# Patient Record
Sex: Female | Born: 1955 | Race: White | Hispanic: No | Marital: Single
Health system: Southern US, Community
[De-identification: ages and names within clinical notes are randomized; demographics above are authoritative.]

## PROBLEM LIST (undated history)

## (undated) DIAGNOSIS — I639 Cerebral infarction, unspecified: Secondary | ICD-10-CM

## (undated) DIAGNOSIS — I4891 Unspecified atrial fibrillation: Secondary | ICD-10-CM

## (undated) DIAGNOSIS — I10 Essential (primary) hypertension: Secondary | ICD-10-CM

## (undated) DIAGNOSIS — E119 Type 2 diabetes mellitus without complications: Secondary | ICD-10-CM

## (undated) HISTORY — PX: EYE SURGERY: SHX253

---

## 2001-11-04 ENCOUNTER — Encounter: Admission: RE | Admit: 2001-11-04 | Discharge: 2002-02-02 | Payer: Self-pay | Admitting: Internal Medicine

## 2009-09-30 DIAGNOSIS — I4891 Unspecified atrial fibrillation: Secondary | ICD-10-CM

## 2009-09-30 HISTORY — DX: Unspecified atrial fibrillation: I48.91

## 2009-10-04 ENCOUNTER — Inpatient Hospital Stay (HOSPITAL_COMMUNITY): Admission: EM | Admit: 2009-10-04 | Discharge: 2009-10-06 | Payer: Self-pay | Admitting: Emergency Medicine

## 2009-10-05 ENCOUNTER — Encounter (INDEPENDENT_AMBULATORY_CARE_PROVIDER_SITE_OTHER): Payer: Self-pay | Admitting: Cardiovascular Disease

## 2009-10-05 ENCOUNTER — Other Ambulatory Visit: Payer: Self-pay | Admitting: Cardiovascular Disease

## 2009-10-06 ENCOUNTER — Encounter (INDEPENDENT_AMBULATORY_CARE_PROVIDER_SITE_OTHER): Payer: Self-pay | Admitting: Cardiovascular Disease

## 2010-12-16 LAB — GLUCOSE, CAPILLARY
Glucose-Capillary: 131 mg/dL — ABNORMAL HIGH (ref 70–99)
Glucose-Capillary: 147 mg/dL — ABNORMAL HIGH (ref 70–99)
Glucose-Capillary: 165 mg/dL — ABNORMAL HIGH (ref 70–99)
Glucose-Capillary: 165 mg/dL — ABNORMAL HIGH (ref 70–99)

## 2010-12-16 LAB — CBC
HCT: 38.7 % (ref 36.0–46.0)
HCT: 42 % (ref 36.0–46.0)
Hemoglobin: 12.9 g/dL (ref 12.0–15.0)
Hemoglobin: 13.2 g/dL (ref 12.0–15.0)
Hemoglobin: 14.2 g/dL (ref 12.0–15.0)
MCHC: 33.7 g/dL (ref 30.0–36.0)
MCV: 93.9 fL (ref 78.0–100.0)
RBC: 4.07 MIL/uL (ref 3.87–5.11)
RBC: 4.16 MIL/uL (ref 3.87–5.11)
RDW: 12.3 % (ref 11.5–15.5)
RDW: 12.6 % (ref 11.5–15.5)
WBC: 5.8 10*3/uL (ref 4.0–10.5)

## 2010-12-16 LAB — BASIC METABOLIC PANEL
CO2: 28 mEq/L (ref 19–32)
Calcium: 8.6 mg/dL (ref 8.4–10.5)
Creatinine, Ser: 0.74 mg/dL (ref 0.4–1.2)
GFR calc non Af Amer: >60 mL/min (ref >60)
Glucose, Bld: 195 mg/dL — ABNORMAL HIGH (ref 70–99)
Glucose, Bld: 266 mg/dL — ABNORMAL HIGH (ref 70–99)
Potassium: 2.8 mEq/L — ABNORMAL LOW (ref 3.5–5.1)
Sodium: 132 mEq/L — ABNORMAL LOW (ref 135–145)

## 2010-12-16 LAB — DIFFERENTIAL
Basophils Absolute: 0 10*3/uL (ref 0.0–0.1)
Eosinophils Relative: 0 % (ref 0–5)
Lymphocytes Relative: 10 % — ABNORMAL LOW (ref 12–46)
Lymphs Abs: 1 10*3/uL (ref 0.7–4.0)
Monocytes Absolute: 0.3 10*3/uL (ref 0.1–1.0)

## 2010-12-16 LAB — PROTIME-INR
INR: 0.88 (ref 0.00–1.49)
Prothrombin Time: 11.9 seconds (ref 11.6–15.2)

## 2010-12-16 LAB — CARDIAC PANEL(CRET KIN+CKTOT+MB+TROPI): CK, MB: 4.8 ng/mL — ABNORMAL HIGH (ref 0.3–4.0)

## 2010-12-16 LAB — LIPID PANEL
Cholesterol: 159 mg/dL (ref 0–200)
HDL: 53 mg/dL (ref >39)
LDL Cholesterol: 87 mg/dL (ref 0–99)
Triglycerides: 93 mg/dL (ref <150)

## 2010-12-16 LAB — POCT CARDIAC MARKERS
CKMB, poc: 3.7 ng/mL (ref 1.0–8.0)
Myoglobin, poc: 81.3 ng/mL (ref 12–200)
Troponin i, poc: <0.05 ng/mL (ref 0.00–0.09)
Troponin i, poc: <0.05 ng/mL (ref 0.00–0.09)

## 2010-12-16 LAB — HEPARIN LEVEL (UNFRACTIONATED): Heparin Unfractionated: 0.5 IU/mL (ref 0.30–0.70)

## 2010-12-16 LAB — BRAIN NATRIURETIC PEPTIDE: Pro B Natriuretic peptide (BNP): 228 pg/mL — ABNORMAL HIGH (ref 0.0–100.0)

## 2016-11-29 ENCOUNTER — Encounter (HOSPITAL_COMMUNITY): Payer: Self-pay | Admitting: *Deleted

## 2016-11-29 ENCOUNTER — Emergency Department (HOSPITAL_COMMUNITY): Payer: BLUE CROSS/BLUE SHIELD

## 2016-11-29 ENCOUNTER — Emergency Department (HOSPITAL_BASED_OUTPATIENT_CLINIC_OR_DEPARTMENT_OTHER)
Admit: 2016-11-29 | Discharge: 2016-11-29 | Disposition: A | Discharge status: Home / Self Care | Payer: BLUE CROSS/BLUE SHIELD | Attending: Emergency Medicine | Admitting: Emergency Medicine

## 2016-11-29 ENCOUNTER — Emergency Department (HOSPITAL_COMMUNITY)
Admission: EM | Admit: 2016-11-29 | Discharge: 2016-11-29 | Disposition: A | Discharge status: Home / Self Care | Payer: BLUE CROSS/BLUE SHIELD | Attending: Emergency Medicine | Admitting: Emergency Medicine

## 2016-11-29 DIAGNOSIS — Z794 Long term (current) use of insulin: Secondary | ICD-10-CM | POA: Diagnosis not present

## 2016-11-29 DIAGNOSIS — M7989 Other specified soft tissue disorders: Secondary | ICD-10-CM

## 2016-11-29 DIAGNOSIS — I10 Essential (primary) hypertension: Secondary | ICD-10-CM | POA: Diagnosis not present

## 2016-11-29 DIAGNOSIS — Z7982 Long term (current) use of aspirin: Secondary | ICD-10-CM | POA: Diagnosis not present

## 2016-11-29 DIAGNOSIS — E119 Type 2 diabetes mellitus without complications: Secondary | ICD-10-CM | POA: Insufficient documentation

## 2016-11-29 DIAGNOSIS — M25522 Pain in left elbow: Secondary | ICD-10-CM | POA: Diagnosis not present

## 2016-11-29 HISTORY — DX: Type 2 diabetes mellitus without complications: E11.9

## 2016-11-29 HISTORY — DX: Essential (primary) hypertension: I10

## 2016-11-29 LAB — CBC
HCT: 40.1 % (ref 36.0–46.0)
HEMOGLOBIN: 14.2 g/dL (ref 12.0–15.0)
MCH: 30.7 pg (ref 26.0–34.0)
MCHC: 35.4 g/dL (ref 30.0–36.0)
MCV: 86.8 fL (ref 78.0–100.0)
Platelets: 289 10*3/uL (ref 150–400)
RBC: 4.62 MIL/uL (ref 3.87–5.11)
RDW: 12.9 % (ref 11.5–15.5)
WBC: 12.8 10*3/uL — AB (ref 4.0–10.5)

## 2016-11-29 LAB — URINALYSIS, ROUTINE W REFLEX MICROSCOPIC
Bacteria, UA: NONE SEEN
Bilirubin Urine: NEGATIVE
Glucose, UA: >=500 mg/dL — AB
KETONES UR: NEGATIVE mg/dL
LEUKOCYTES UA: NEGATIVE
NITRITE: NEGATIVE
PH: 8 (ref 5.0–8.0)
Protein, ur: >=300 mg/dL — AB
SPECIFIC GRAVITY, URINE: 1.003 — AB (ref 1.005–1.030)
Squamous Epithelial / LPF: NONE SEEN

## 2016-11-29 LAB — COMPREHENSIVE METABOLIC PANEL
ALBUMIN: 4.2 g/dL (ref 3.5–5.0)
ALK PHOS: 70 U/L (ref 38–126)
ALT: 21 U/L (ref 14–54)
ANION GAP: 11 (ref 5–15)
AST: 35 U/L (ref 15–41)
BILIRUBIN TOTAL: 1.9 mg/dL — AB (ref 0.3–1.2)
BUN: 13 mg/dL (ref 6–20)
CALCIUM: 9 mg/dL (ref 8.9–10.3)
CO2: 26 mmol/L (ref 22–32)
Chloride: 88 mmol/L — ABNORMAL LOW (ref 101–111)
Creatinine, Ser: 0.83 mg/dL (ref 0.44–1.00)
GLUCOSE: 234 mg/dL — AB (ref 65–99)
POTASSIUM: 3.2 mmol/L — AB (ref 3.5–5.1)
Sodium: 125 mmol/L — ABNORMAL LOW (ref 135–145)
TOTAL PROTEIN: 8.3 g/dL — AB (ref 6.5–8.1)

## 2016-11-29 LAB — LIPASE, BLOOD: Lipase: 16 U/L (ref 11–51)

## 2016-11-29 LAB — TROPONIN I: Troponin I: <0.03 ng/mL (ref <0.03)

## 2016-11-29 MED ORDER — SODIUM CHLORIDE 0.9 % IV BOLUS (SEPSIS)
1000.0000 mL | Freq: Once | INTRAVENOUS | Status: AC
  Administered 2016-11-29: 1000 mL via INTRAVENOUS

## 2016-11-29 MED ORDER — ONDANSETRON HCL 4 MG/2ML IJ SOLN: 4.0000 mg | Freq: Once | INTRAMUSCULAR | Status: DC | PRN

## 2016-11-29 MED ORDER — ONDANSETRON HCL 4 MG/2ML IJ SOLN
4.0000 mg | Freq: Once | INTRAMUSCULAR | Status: AC
  Administered 2016-11-29: 4 mg via INTRAVENOUS
  Filled 2016-11-29: qty 2

## 2016-11-29 MED ORDER — MORPHINE SULFATE (PF) 4 MG/ML IV SOLN
6.0000 mg | Freq: Once | INTRAVENOUS | Status: AC
  Administered 2016-11-29: 6 mg via INTRAVENOUS
  Filled 2016-11-29: qty 2

## 2016-11-29 MED ORDER — ONDANSETRON HCL 4 MG PO TABS: 8.0000 mg | ORAL_TABLET | Freq: Three times a day (TID) | ORAL | 0 refills | Status: AC | PRN

## 2016-11-29 MED ORDER — HYDROCODONE-ACETAMINOPHEN 5-325 MG PO TABS: 1.0000 | ORAL_TABLET | ORAL | 0 refills | Status: DC | PRN

## 2016-11-29 NOTE — ED Triage Notes (Addendum)
Pt reports vomiting since yesterday, denies any abd pain or diarrhea at this time.  Pt also reports L hand swelling and pain that radiates to her L elbow.  Swelling noted.  Pt denies any injury.  Pt reports she has not been able to eat or drink much.  Feels slightly dizzy.  Pt is A&Ox 4.  Pt is a poor historian, when asked if she is okay with receiving blood transfusions if she was found to have a low Hgb, pt states "I don't know, I will have to ask my mother."  Pt is also unsure whether she is allergic to penicillin or anything.

## 2016-11-29 NOTE — ED Provider Notes (Signed)
[facility]-EMERGENCY DEPT Provider Note   CSN: [id number] Arrival date & time: 11/29/16  1139     History   Chief Complaint Chief Complaint  Patient presents with  . Vomiting  . Arm Swelling    HPI Debra Sanders is a 61 y.o. female.  HPI Patient reports increasing swelling of her left arm as well as increasing pain in her left elbow over the past several days.  She also reports nausea and vomiting today with decreased oral intake and mild lightheadedness.  No syncope.  No chest pain or palpitations.  Pain in her left elbow is moderate in severity.  No history DVT.  No weakness of her left arm.  No known trauma to her left arm.  Denies warmth or redness to her left upper extremity.  Symptoms are moderate in severity.  No abdominal pain.  No dysuria or urinary frequency   Past Medical History:  Diagnosis Date  . Diabetes mellitus without complication (HCC)   . Hypertension     There are no active problems to display for this patient.   History reviewed. No pertinent surgical history.  OB History    No data available       Home Medications    Prior to Admission medications   Medication Sig Start Date End Date Taking? Authorizing Provider  amLODipine (NORVASC) 5 MG tablet Take 5 mg by mouth daily.   Yes Historical Provider, MD  aspirin EC 81 MG tablet Take 81 mg by mouth every other day.   Yes Historical Provider, MD  digoxin (LANOXIN) 0.125 MG tablet Take 0.0625 mg by mouth daily at 12 noon.    Yes Historical Provider, MD  glimepiride (AMARYL) 4 MG tablet Take 4 mg by mouth every morning. 09/26/16  Yes Historical Provider, MD  insulin glargine (LANTUS) 100 UNIT/ML injection Inject 30 Units into the skin daily after lunch.   Yes Historical Provider, MD  metFORMIN (GLUCOPHAGE) 500 MG tablet Take 1,000 mg by mouth 2 (two) times daily with a meal.   Yes Historical Provider, MD  metoprolol tartrate (LOPRESSOR) 25 MG tablet Take 25 mg by mouth 2 (two) times daily. 11/20/16  Yes  Historical Provider, MD  oxymetazoline (AFRIN) 0.05 % nasal spray Place 1 spray into both nostrils 2 (two) times daily.   Yes Historical Provider, MD  potassium chloride SA (K-DUR,KLOR-CON) 20 MEQ tablet Take 20 mEq by mouth 2 (two) times daily. 10/23/16  Yes Historical Provider, MD  quinapril (ACCUPRIL) 40 MG tablet Take 20-40 mg by mouth 2 (two) times daily. 40 mg qam & 20 mg qhs   Yes Historical Provider, MD  rosuvastatin (CRESTOR) 10 MG tablet Take 10 mg by mouth daily.   Yes Historical Provider, MD  vitamin E 400 UNIT capsule Take 400 Units by mouth every morning.   Yes Historical Provider, MD  HYDROcodone-acetaminophen (NORCO/VICODIN) 5-325 MG tablet Take 1 tablet by mouth every 4 (four) hours as needed for moderate pain. 11/29/16   Azalia Bilis, MD    Family History No family history on file.  Social History Social History  Substance Use Topics  . Smoking status: Never Smoker  . Smokeless tobacco: Never Used  . Alcohol use No     Allergies   Penicillins   Review of Systems Review of Systems  All other systems reviewed and are negative.    Physical Exam Updated Vital Signs BP 170/99   Pulse 70   Temp 97.6 F (36.4 C) (Oral)   Resp 16  Ht 5\' 2"  (1.575 m)   Wt 150 lb (68 kg)   SpO2 95%   BMI 27.44 kg/m   Physical Exam  Constitutional: She is oriented to person, place, and time. She appears well-developed and well-nourished. No distress.  HENT:  Head: Normocephalic and atraumatic.  Eyes: EOM are normal.  Neck: Normal range of motion.  Cardiovascular: Normal rate, regular rhythm and normal heart sounds.   Pulmonary/Chest: Effort normal and breath sounds normal.  Abdominal: Soft. She exhibits no distension. There is no tenderness.  Musculoskeletal: Normal range of motion.  Neurological: She is alert and oriented to person, place, and time.  Skin: Skin is warm and dry.  Psychiatric: She has a normal mood and affect. Judgment normal.  Nursing note and vitals  reviewed.    ED Treatments / Results  Labs (all labs ordered are listed, but only abnormal results are displayed) Labs Reviewed  COMPREHENSIVE METABOLIC PANEL - Abnormal; Notable for the following:       Result Value   Sodium 125 (*)    Potassium 3.2 (*)    Chloride 88 (*)    Glucose, Bld 234 (*)    Total Protein 8.3 (*)    Total Bilirubin 1.9 (*)    All other components within normal limits  CBC - Abnormal; Notable for the following:    WBC 12.8 (*)    All other components within normal limits  URINALYSIS, ROUTINE W REFLEX MICROSCOPIC - Abnormal; Notable for the following:    Color, Urine STRAW (*)    Specific Gravity, Urine 1.003 (*)    Glucose, UA >=500 (*)    Hgb urine dipstick SMALL (*)    Protein, ur >=300 (*)    All other components within normal limits  LIPASE, BLOOD  TROPONIN I    EKG  EKG Interpretation None       Radiology Dg Elbow Complete Left  Result Date: 11/29/2016 CLINICAL DATA:  Left elbow pain and swelling for 1 day. EXAM: LEFT ELBOW - COMPLETE 3+ VIEW COMPARISON:  None. FINDINGS: There is no evidence of acute fracture, dislocation, or definite elbow joint effusion. Moderate degenerative spurring is noted involving the distal humerus and proximal ulna. There is milder spurring of the radial head. Faint calcification along the radiocapitellar joint is consistent with chondrocalcinosis. No destructive osseous lesion is seen. IMPRESSION: 1. No evidence of acute osseous abnormality. 2. Moderate degenerative spurring about the elbow. Chondrocalcinosis. Electronically Signed   By: Sebastian AcheAllen  Grady M.D.   On: 11/29/2016 13:18    Procedures Procedures (including critical care time)  Medications Ordered in ED Medications  ondansetron (ZOFRAN) injection 4 mg (not administered)  morphine 4 MG/ML injection 6 mg (6 mg Intravenous Given 11/29/16 1357)  ondansetron (ZOFRAN) injection 4 mg (4 mg Intravenous Given 11/29/16 1357)     Initial Impression / Assessment and  Plan / ED Course  I have reviewed the triage vital signs and the nursing notes.  Pertinent labs & imaging results that were available during my care of the patient were reviewed by me and considered in my medical decision making (see chart for details).     Ultrasound of the left upper extremity is negative for DVT.  X-ray left elbow is normal.  This may represent an inflammatory arthritis of the left elbow.  Recommend elevation of the left arm and anti-inflammatories and pain medication.  In regards to her nausea vomiting this may be more viral related.  Her abdominal exam is nontender.  She will  receive IV fluids here in the emergency department.  Her sodium was noted to be mildly low at 125.  This will need to be rechecked by her doctors.  I will encourage her ongoing oral hydration at home  Patient family understand return to the ER for new or worsening symptoms  Final Clinical Impressions(s) / ED Diagnoses   Final diagnoses:  Left elbow pain  Left arm swelling    New Prescriptions New Prescriptions   HYDROCODONE-ACETAMINOPHEN (NORCO/VICODIN) 5-325 MG TABLET    Take 1 tablet by mouth every 4 (four) hours as needed for moderate pain.     Azalia Bilis, MD 11/29/16 [phone]

## 2016-11-29 NOTE — ED Notes (Signed)
Bed: WA13 Expected date:  Expected time:  Means of arrival:  Comments: Hold for Resus B 

## 2016-11-29 NOTE — Progress Notes (Signed)
*  Preliminary Results* Left upper extremity venous duplex completed. Left upper extremity is negative for deep and superficial vein thrombosis.  11/29/2016 3:27 PM  Gertie FeyMichelle Ragan Reale, BS, RVT, RDCS, RDMS

## 2016-11-29 NOTE — ED Notes (Signed)
Pt's son, comes out of the room, states pt is now c/o pain "in her heart, I think she is having a heart attack, I don't know."

## 2017-08-29 ENCOUNTER — Other Ambulatory Visit: Payer: Self-pay | Admitting: Cardiovascular Disease

## 2017-08-29 DIAGNOSIS — I639 Cerebral infarction, unspecified: Secondary | ICD-10-CM

## 2017-09-07 ENCOUNTER — Inpatient Hospital Stay
Admission: RE | Admit: 2017-09-07 | Discharge: 2017-09-07 | Disposition: A | Discharge status: Home / Self Care | Payer: BLUE CROSS/BLUE SHIELD | Source: Ambulatory Visit | Attending: Cardiovascular Disease | Admitting: Cardiovascular Disease

## 2017-09-07 ENCOUNTER — Other Ambulatory Visit: Payer: BLUE CROSS/BLUE SHIELD

## 2017-09-17 ENCOUNTER — Ambulatory Visit
Admission: RE | Admit: 2017-09-17 | Discharge: 2017-09-17 | Disposition: A | Discharge status: Home / Self Care | Payer: BLUE CROSS/BLUE SHIELD | Source: Ambulatory Visit | Attending: Cardiovascular Disease | Admitting: Cardiovascular Disease

## 2017-09-17 DIAGNOSIS — I639 Cerebral infarction, unspecified: Secondary | ICD-10-CM

## 2017-09-18 ENCOUNTER — Other Ambulatory Visit: Payer: Self-pay | Admitting: Cardiovascular Disease

## 2017-09-18 DIAGNOSIS — I639 Cerebral infarction, unspecified: Secondary | ICD-10-CM

## 2017-09-18 DIAGNOSIS — H547 Unspecified visual loss: Secondary | ICD-10-CM

## 2017-09-24 ENCOUNTER — Ambulatory Visit
Admission: RE | Admit: 2017-09-24 | Discharge: 2017-09-24 | Disposition: A | Discharge status: Home / Self Care | Payer: BLUE CROSS/BLUE SHIELD | Source: Ambulatory Visit | Attending: Cardiovascular Disease | Admitting: Cardiovascular Disease

## 2017-09-24 ENCOUNTER — Other Ambulatory Visit: Payer: BLUE CROSS/BLUE SHIELD

## 2017-09-24 DIAGNOSIS — H547 Unspecified visual loss: Secondary | ICD-10-CM

## 2017-09-24 DIAGNOSIS — I639 Cerebral infarction, unspecified: Secondary | ICD-10-CM

## 2018-01-31 IMAGING — CT CT HEAD W/O CM
3 series · 15 of 47 positions shown, 18 images · non-contrast
Comparison: None.

CLINICAL DATA: Dizziness, unsteady gait, vision changes.

EXAM:
CT HEAD WITHOUT CONTRAST
TECHNIQUE: Contiguous axial images were obtained from the base of the skull
through the vertex without intravenous contrast.

[Series 2: head w/(date) · axial · 0.44mm/px · z∈[+46,+176]mm · 9 of 32 slices shown, 12 images]
[im 3/32  brain]
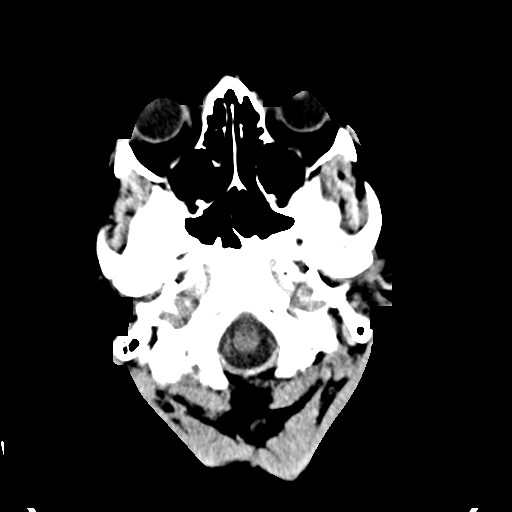
[im 3/32  bone]
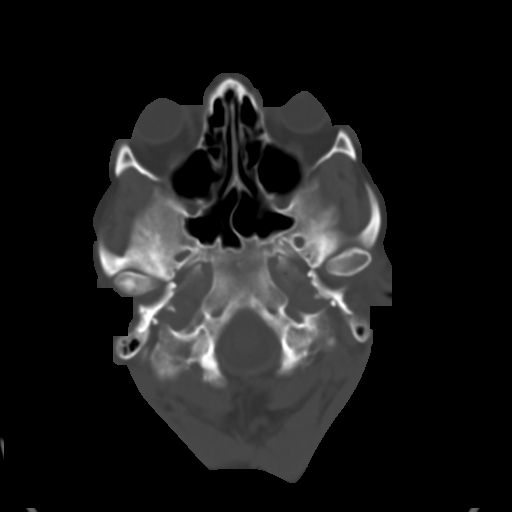
[im 6/32  brain]
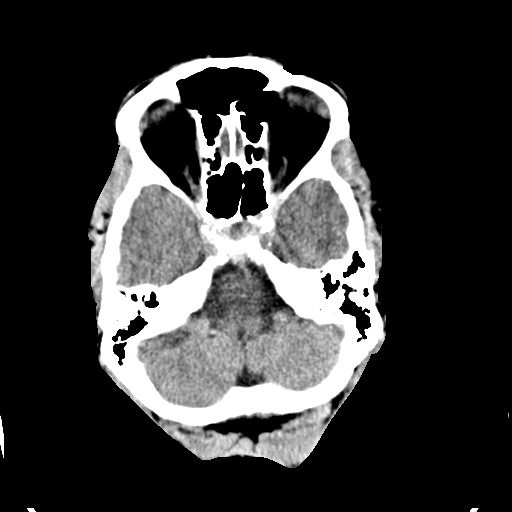
[im 9/32  brain]
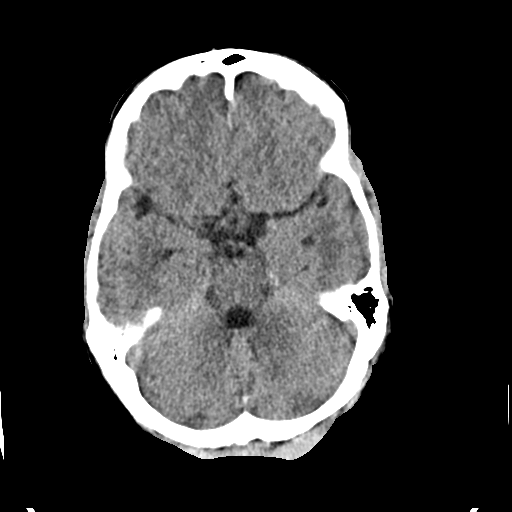
[im 12/32  brain]
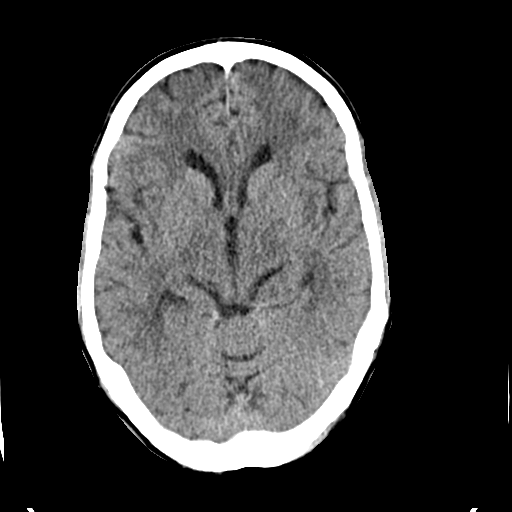
[im 17/32  brain]
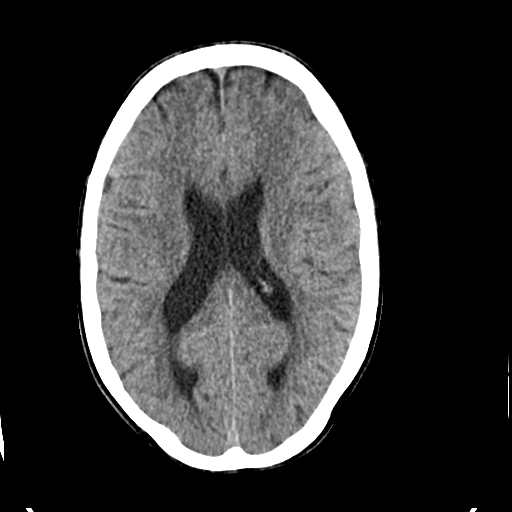
[im 17/32  bone]
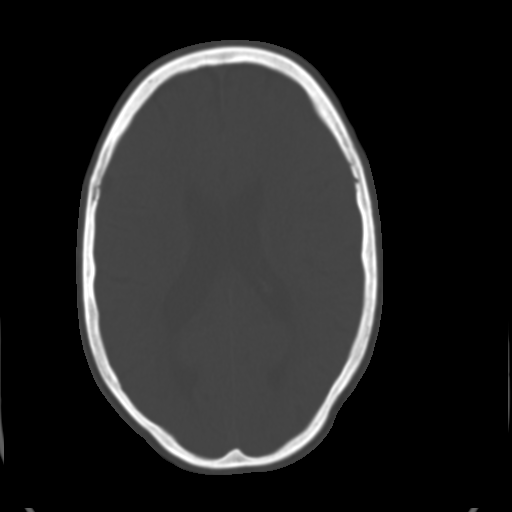
[im 20/32  brain]
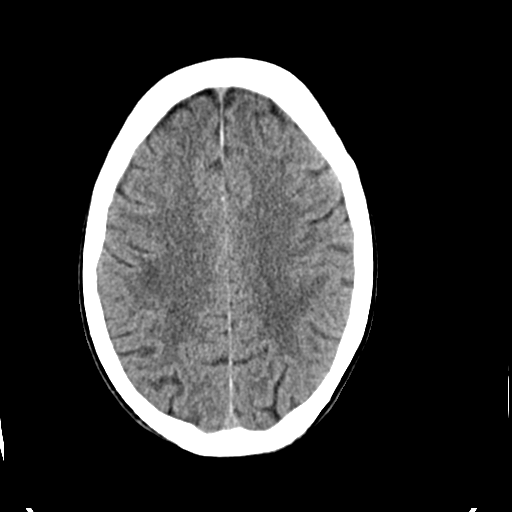
[im 23/32  brain]
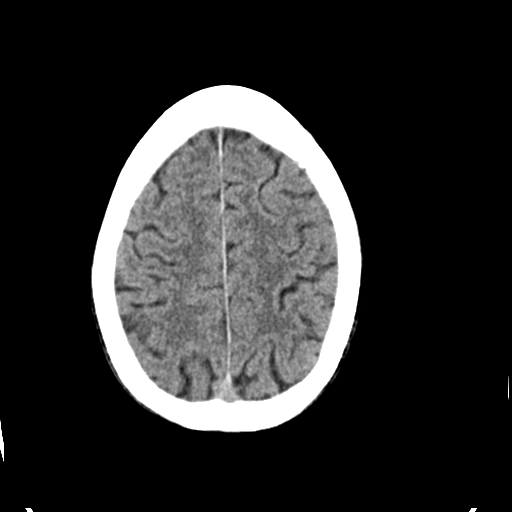
[im 26/32  brain]
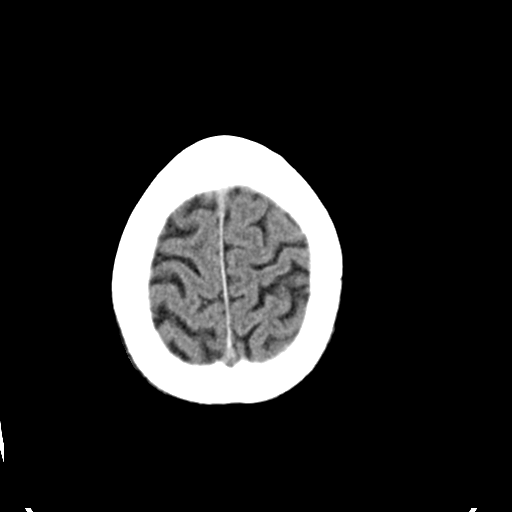
[im 29/32  brain]
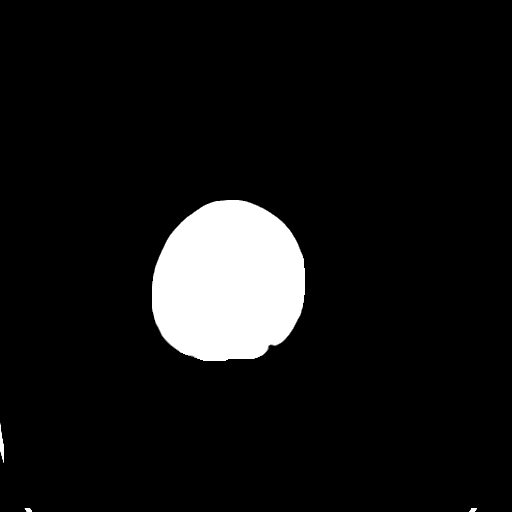
[im 29/32  bone]
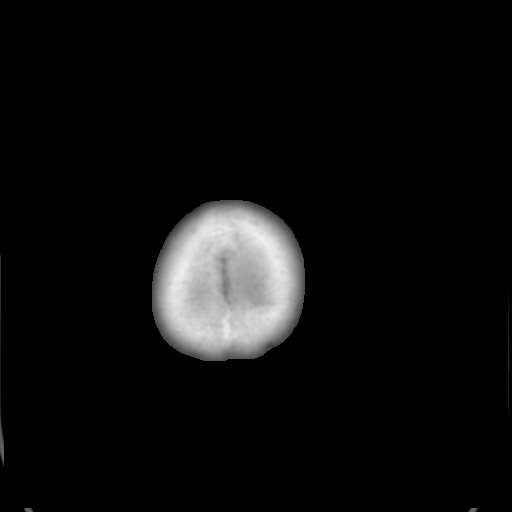

[Series 4: cor · coronal · 0.30mm/px · 3 of 66 slices shown]
[im 22/66  brain]
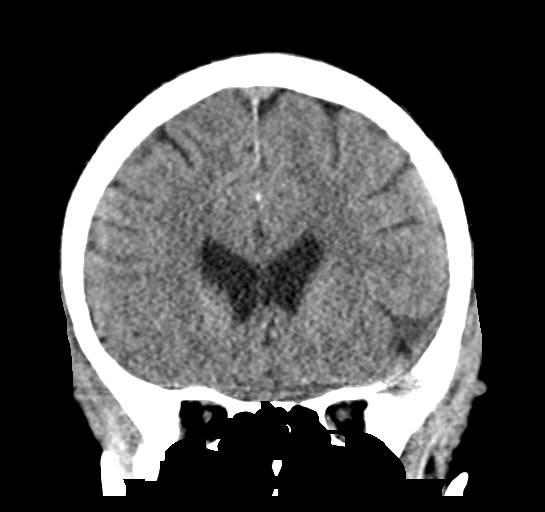
[im 29/66  brain]
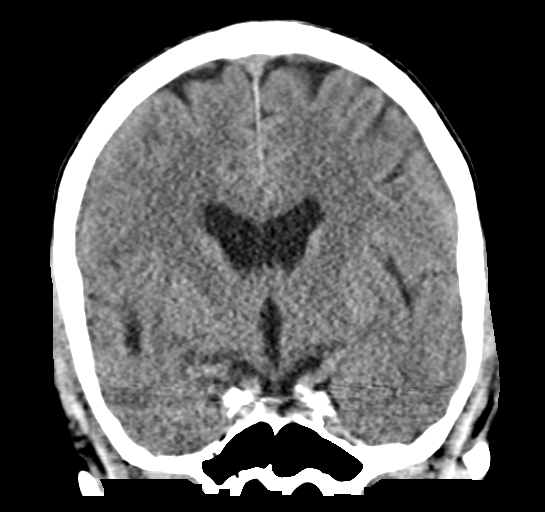
[im 37/66  brain]
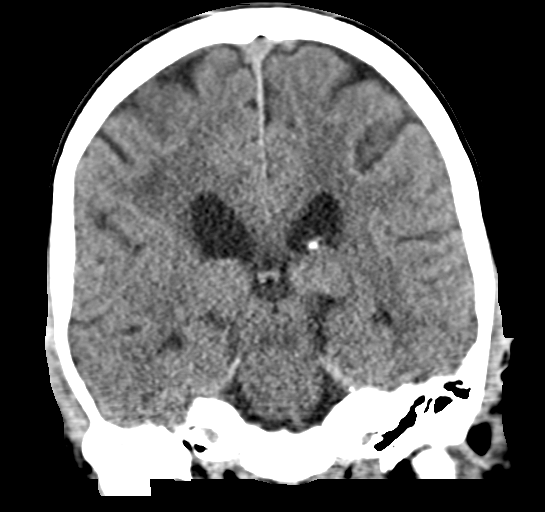

[Series 5: sag · sagittal · 0.29mm/px · 3 of 49 slices shown]
[im 17/49  brain]
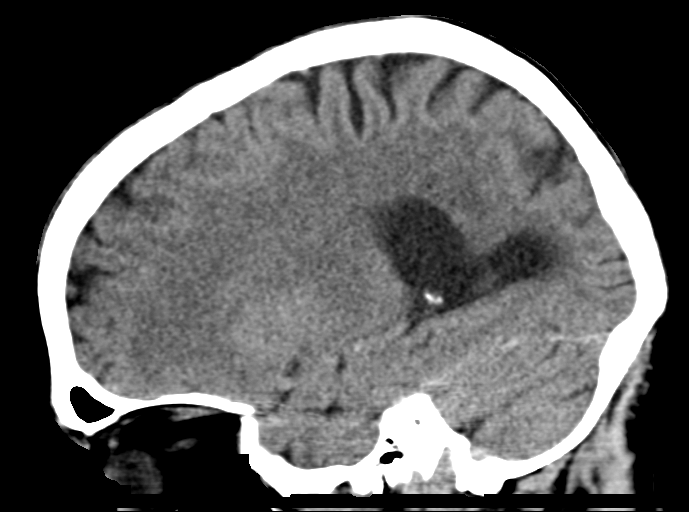
[im 25/49  brain]
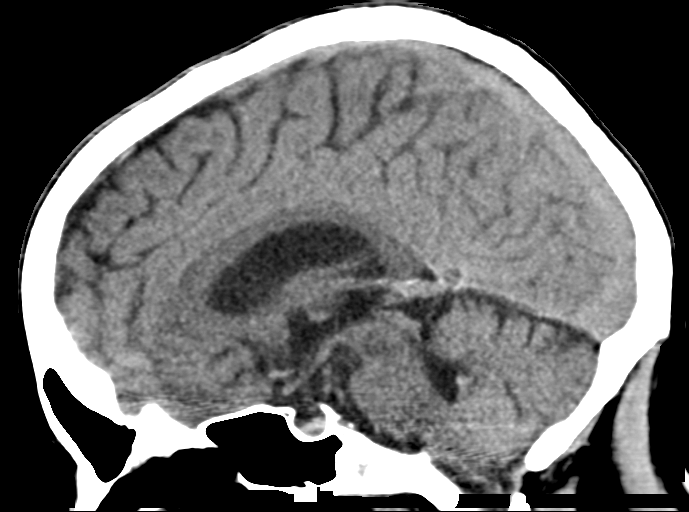
[im 33/49  brain]
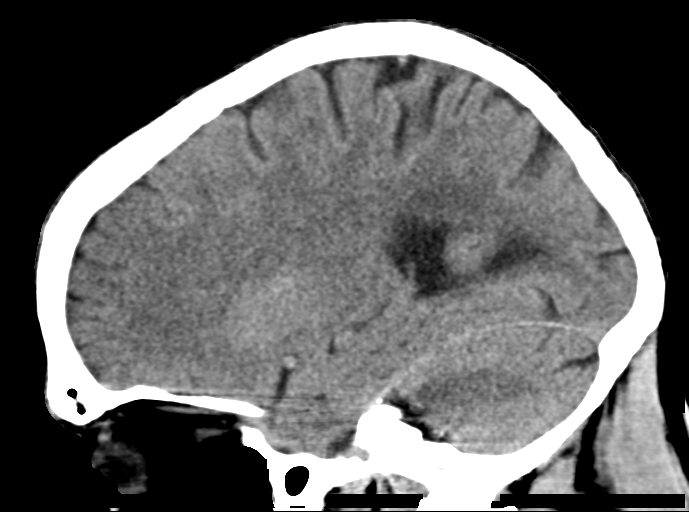

[15 of 47 positions shown; findings below may reference images not displayed]

FINDINGS: Brain: Patchy scattered low-density throughout the deep white matter
compatible with chronic microvascular disease. No acute intracranial
abnormality. Specifically, no hemorrhage, hydrocephalus, mass
lesion, acute infarction, or significant intracranial injury.

Vascular: No hyperdense vessel or unexpected calcification.

Skull: No acute calvarial abnormality.

Sinuses/Orbits: Visualized paranasal sinuses and mastoids clear.
Orbital soft tissues unremarkable.

Other: None
IMPRESSION: Mild chronic small vessel disease in the deep white matter. No acute
intracranial abnormality.

## 2018-06-15 ENCOUNTER — Ambulatory Visit (HOSPITAL_COMMUNITY)
Admission: RE | Admit: 2018-06-15 | Discharge: 2018-06-15 | Disposition: A | Discharge status: Home / Self Care | Payer: BLUE CROSS/BLUE SHIELD | Source: Ambulatory Visit | Attending: Orthopedic Surgery | Admitting: Orthopedic Surgery

## 2018-06-15 ENCOUNTER — Other Ambulatory Visit (HOSPITAL_COMMUNITY): Payer: Self-pay | Admitting: Orthopedic Surgery

## 2018-06-15 DIAGNOSIS — M79604 Pain in right leg: Secondary | ICD-10-CM

## 2018-06-15 DIAGNOSIS — R590 Localized enlarged lymph nodes: Secondary | ICD-10-CM | POA: Diagnosis not present

## 2018-06-15 DIAGNOSIS — M7989 Other specified soft tissue disorders: Secondary | ICD-10-CM

## 2018-06-15 NOTE — Progress Notes (Addendum)
RLE venous duplex prelim: negative for DVT.  Farrel DemarkJill Eunice, RDMS, RVT   Called results to Dr. Madelon Lipsaffrey.

## 2018-06-25 ENCOUNTER — Ambulatory Visit (INDEPENDENT_AMBULATORY_CARE_PROVIDER_SITE_OTHER): Payer: BLUE CROSS/BLUE SHIELD | Admitting: Orthopedic Surgery

## 2018-06-25 ENCOUNTER — Encounter (HOSPITAL_COMMUNITY): Payer: Self-pay | Admitting: *Deleted

## 2018-06-25 ENCOUNTER — Other Ambulatory Visit: Payer: Self-pay

## 2018-06-25 ENCOUNTER — Encounter (INDEPENDENT_AMBULATORY_CARE_PROVIDER_SITE_OTHER): Payer: Self-pay | Admitting: Orthopedic Surgery

## 2018-06-25 ENCOUNTER — Ambulatory Visit (INDEPENDENT_AMBULATORY_CARE_PROVIDER_SITE_OTHER): Payer: Self-pay | Admitting: Physician Assistant

## 2018-06-25 DIAGNOSIS — M71161 Other infective bursitis, right knee: Secondary | ICD-10-CM

## 2018-06-25 NOTE — Progress Notes (Signed)
   Office Visit Note   Patient: Debra Sanders           Date of Birth: 26-Dec-1955           MRN: [id number] Visit Date: 06/25/2018              Requested by: Georgianne Fick, MD [address] [place], [place] [address] PCP: Georgianne Fick, MD  Chief Complaint  Patient presents with  . Right Knee - Pain      HPI: Patient is a 62 year old woman who presents in referral from Dr. Madelon Lips for a septic prepatellar bursitis right knee.  Patient is currently on doxycycline and Keflex she has had the wound aspirated but we do not have cultures she has had radiographs these are reviewed.  Patient states she is an uncontrolled type II diabetic she is unaware of her hemoglobin A1c.  Assessment & Plan: Visit Diagnoses:  1. Septic prepatellar bursitis of right knee     Plan: We will plan for surgery tomorrow for excision of the prepatellar bursa if this does not appear to affect the knee joint.  Will plan on final antibiotic treatment pending culture results.  Risks and benefits were discussed with the patient and her mother they agree and wish to proceed with surgery at this time.  Follow-Up Instructions: Return in about 1 week (around 07/02/2018).   Ortho Exam  Patient is alert, oriented, no adenopathy, well-dressed, normal affect, normal respiratory effort. Examination patient has a good dorsalis pedis pulse she does have venous stasis swelling with pitting edema but no open ulcers.  She has a red swollen prepatellar bursa with an open wound about 3 x 1 mm.  There is no purulent drainage but there is bloody drainage.  There is no effusion within the knee palpation around the knee is nontender she is only tender to palpation over the prepatellar bursa.  Review of the radiographs shows no bony abnormalities no signs of chronic osteomyelitis.  Imaging: No results found. No images are attached to the encounter.  Labs: No results found for: HGBA1C, ESRSEDRATE, CRP,  LABURIC, REPTSTATUS, GRAMSTAIN, CULT, LABORGA   Lab Results  Component Value Date   ALBUMIN 4.2 11/29/2016    There is no height or weight on file to calculate BMI.  Orders:  No orders of the defined types were placed in this encounter.  No orders of the defined types were placed in this encounter.    Procedures: No procedures performed  Clinical Data: No additional findings.  ROS:  All other systems negative, except as noted in the HPI. Review of Systems  Objective: Vital Signs: There were no vitals taken for this visit.  Specialty Comments:  No specialty comments available.  PMFS History: Patient Active Problem List   Diagnosis Date Noted  . Septic prepatellar bursitis of right knee 06/25/2018   Past Medical History:  Diagnosis Date  . Diabetes mellitus without complication (HCC)   . Hypertension     History reviewed. No pertinent family history.  History reviewed. No pertinent surgical history. Social History   Occupational History  . Not on file  Tobacco Use  . Smoking status: Never Smoker  . Smokeless tobacco: Never Used  Substance and Sexual Activity  . Alcohol use: No  . Drug use: No  . Sexual activity: Not on file

## 2018-06-25 NOTE — Progress Notes (Addendum)
Pt denies cardiac history, states she sees Dr. Algie Coffer for HTN. She had an ED admission in 2011 with new onset of A-fib. Pt doesn't remember being diagnosed with A-fib. Pt is a type 2 diabetic. She said her blood sugar this AM was 67. Pt states she takes her Lantus insulin at 3 PM every day because her sister told her that was the best time to take it. Pt instructed not to take any diabetic medications in the AM. Instructed her to check her blood sugar when she gets up in the AM and every 2 hours until she leaves for the hospital. If blood sugar is 70 or below, treat with 1/2 cup of clear juice (apple or cranberry) and recheck blood sugar 15 minutes after drinking juice. If blood sugar continues to be 70 or below, call the Short Stay department and ask to speak to a nurse. She voiced understanding.   Have requested recent EKG, last office visit notes and any other heart studies from Dr. Roseanne Kaufman office. Dr. Carolyn Stare office reported that pt's last A1C was 7.8 on 05/18/18.

## 2018-06-26 ENCOUNTER — Encounter (HOSPITAL_COMMUNITY)
Admission: RE | Disposition: A | Discharge status: Home / Self Care | Payer: Self-pay | Source: Ambulatory Visit | Attending: Orthopedic Surgery

## 2018-06-26 ENCOUNTER — Encounter (HOSPITAL_COMMUNITY): Payer: Self-pay

## 2018-06-26 ENCOUNTER — Ambulatory Visit (HOSPITAL_COMMUNITY)
Admission: RE | Admit: 2018-06-26 | Discharge: 2018-06-26 | Disposition: A | Discharge status: Home / Self Care | Payer: BLUE CROSS/BLUE SHIELD | Source: Ambulatory Visit | Attending: Orthopedic Surgery | Admitting: Orthopedic Surgery

## 2018-06-26 ENCOUNTER — Ambulatory Visit (HOSPITAL_COMMUNITY): Payer: BLUE CROSS/BLUE SHIELD | Admitting: Anesthesiology

## 2018-06-26 DIAGNOSIS — E119 Type 2 diabetes mellitus without complications: Secondary | ICD-10-CM | POA: Diagnosis not present

## 2018-06-26 DIAGNOSIS — I4891 Unspecified atrial fibrillation: Secondary | ICD-10-CM | POA: Insufficient documentation

## 2018-06-26 DIAGNOSIS — I447 Left bundle-branch block, unspecified: Secondary | ICD-10-CM | POA: Diagnosis not present

## 2018-06-26 DIAGNOSIS — M7041 Prepatellar bursitis, right knee: Secondary | ICD-10-CM | POA: Insufficient documentation

## 2018-06-26 DIAGNOSIS — Z880 Allergy status to penicillin: Secondary | ICD-10-CM | POA: Diagnosis not present

## 2018-06-26 DIAGNOSIS — I10 Essential (primary) hypertension: Secondary | ICD-10-CM | POA: Diagnosis not present

## 2018-06-26 DIAGNOSIS — M71161 Other infective bursitis, right knee: Secondary | ICD-10-CM

## 2018-06-26 DIAGNOSIS — Z8673 Personal history of transient ischemic attack (TIA), and cerebral infarction without residual deficits: Secondary | ICD-10-CM | POA: Insufficient documentation

## 2018-06-26 HISTORY — DX: Unspecified atrial fibrillation: I48.91

## 2018-06-26 HISTORY — DX: Cerebral infarction, unspecified: I63.9

## 2018-06-26 HISTORY — PX: I & D EXTREMITY: SHX5045

## 2018-06-26 HISTORY — PX: APPLICATION OF WOUND VAC: SHX5189

## 2018-06-26 LAB — GLUCOSE, CAPILLARY
Glucose-Capillary: 149 mg/dL — ABNORMAL HIGH (ref 70–99)
Glucose-Capillary: 133 mg/dL — ABNORMAL HIGH (ref 70–99)

## 2018-06-26 LAB — CBC
HCT: 39 % (ref 36.0–46.0)
HEMOGLOBIN: 13 g/dL (ref 12.0–15.0)
MCH: 30.1 pg (ref 26.0–34.0)
MCHC: 33.3 g/dL (ref 30.0–36.0)
MCV: 90.3 fL (ref 78.0–100.0)
PLATELETS: 378 10*3/uL (ref 150–400)
RBC: 4.32 MIL/uL (ref 3.87–5.11)
RDW: 12.6 % (ref 11.5–15.5)
WBC: 8.6 10*3/uL (ref 4.0–10.5)

## 2018-06-26 LAB — BASIC METABOLIC PANEL
ANION GAP: 11 (ref 5–15)
BUN: 15 mg/dL (ref 8–23)
CO2: 27 mmol/L (ref 22–32)
CREATININE: 1.01 mg/dL — AB (ref 0.44–1.00)
Calcium: 9.1 mg/dL (ref 8.9–10.3)
Chloride: 96 mmol/L — ABNORMAL LOW (ref 98–111)
GFR calc non Af Amer: 58 mL/min — ABNORMAL LOW (ref >60)
Glucose, Bld: 159 mg/dL — ABNORMAL HIGH (ref 70–99)
POTASSIUM: 3.1 mmol/L — AB (ref 3.5–5.1)
SODIUM: 134 mmol/L — AB (ref 135–145)

## 2018-06-26 SURGERY — IRRIGATION AND DEBRIDEMENT EXTREMITY
Anesthesia: General | Site: Knee | Laterality: Right

## 2018-06-26 MED ORDER — PROPOFOL 10 MG/ML IV BOLUS
INTRAVENOUS | Status: AC
  Filled 2018-06-26: qty 20

## 2018-06-26 MED ORDER — HYDROCODONE-ACETAMINOPHEN 7.5-325 MG PO TABS: 1.0000 | ORAL_TABLET | Freq: Once | ORAL | Status: DC | PRN

## 2018-06-26 MED ORDER — CHLORHEXIDINE GLUCONATE 4 % EX LIQD: 60.0000 mL | Freq: Once | CUTANEOUS | Status: DC

## 2018-06-26 MED ORDER — HYDRALAZINE HCL 20 MG/ML IJ SOLN
INTRAMUSCULAR | Status: AC
  Filled 2018-06-26: qty 1

## 2018-06-26 MED ORDER — HYDROCODONE-ACETAMINOPHEN 7.5-325 MG PO TABS
ORAL_TABLET | ORAL | Status: AC
  Filled 2018-06-26: qty 1

## 2018-06-26 MED ORDER — LACTATED RINGERS IV SOLN
INTRAVENOUS | Status: DC | PRN
  Administered 2018-06-26: 12:00:00 via INTRAVENOUS

## 2018-06-26 MED ORDER — ACETAMINOPHEN 10 MG/ML IV SOLN: 1000.0000 mg | Freq: Once | INTRAVENOUS | Status: DC | PRN

## 2018-06-26 MED ORDER — ACETAMINOPHEN 500 MG PO TABS
ORAL_TABLET | ORAL | Status: AC
  Administered 2018-06-26: 1000 mg
  Filled 2018-06-26: qty 2

## 2018-06-26 MED ORDER — HYDRALAZINE HCL 20 MG/ML IJ SOLN
10.0000 mg | Freq: Once | INTRAMUSCULAR | Status: AC
  Administered 2018-06-26: 10 mg via INTRAVENOUS

## 2018-06-26 MED ORDER — CEFAZOLIN SODIUM-DEXTROSE 2-4 GM/100ML-% IV SOLN
2.0000 g | INTRAVENOUS | Status: AC
  Administered 2018-06-26: 2 g via INTRAVENOUS

## 2018-06-26 MED ORDER — LIDOCAINE 2% (20 MG/ML) 5 ML SYRINGE
INTRAMUSCULAR | Status: AC
  Filled 2018-06-26: qty 5

## 2018-06-26 MED ORDER — METOPROLOL SUCCINATE ER 25 MG PO TB24
ORAL_TABLET | ORAL | Status: AC
  Administered 2018-06-26: 25 mg
  Filled 2018-06-26: qty 1

## 2018-06-26 MED ORDER — ONDANSETRON HCL 4 MG/2ML IJ SOLN
INTRAMUSCULAR | Status: AC
  Filled 2018-06-26: qty 2

## 2018-06-26 MED ORDER — HYDROCODONE-ACETAMINOPHEN 5-325 MG PO TABS: 1.0000 | ORAL_TABLET | ORAL | 0 refills | Status: AC | PRN

## 2018-06-26 MED ORDER — LIDOCAINE HCL (CARDIAC) PF 100 MG/5ML IV SOSY
PREFILLED_SYRINGE | INTRAVENOUS | Status: DC | PRN
  Administered 2018-06-26: 20 mg via INTRATRACHEAL

## 2018-06-26 MED ORDER — METOPROLOL TARTRATE 25 MG PO TABS: 25.0000 mg | ORAL_TABLET | Freq: Two times a day (BID) | ORAL | Status: DC

## 2018-06-26 MED ORDER — FENTANYL CITRATE (PF) 250 MCG/5ML IJ SOLN
INTRAMUSCULAR | Status: AC
  Filled 2018-06-26: qty 5

## 2018-06-26 MED ORDER — GLYCOPYRROLATE 0.2 MG/ML IJ SOLN
INTRAMUSCULAR | Status: DC | PRN
  Administered 2018-06-26: 0.2 mg via INTRAVENOUS

## 2018-06-26 MED ORDER — GLYCOPYRROLATE PF 0.2 MG/ML IJ SOSY
PREFILLED_SYRINGE | INTRAMUSCULAR | Status: AC
  Filled 2018-06-26: qty 1

## 2018-06-26 MED ORDER — FENTANYL CITRATE (PF) 250 MCG/5ML IJ SOLN
INTRAMUSCULAR | Status: DC | PRN
  Administered 2018-06-26: 100 ug via INTRAVENOUS

## 2018-06-26 MED ORDER — MIDAZOLAM HCL 2 MG/2ML IJ SOLN
INTRAMUSCULAR | Status: DC | PRN
  Administered 2018-06-26 (×2): 1 mg via INTRAVENOUS

## 2018-06-26 MED ORDER — MEPERIDINE HCL 50 MG/ML IJ SOLN: 6.2500 mg | INTRAMUSCULAR | Status: DC | PRN

## 2018-06-26 MED ORDER — PROPOFOL 10 MG/ML IV BOLUS
INTRAVENOUS | Status: DC | PRN
  Administered 2018-06-26: 80 mg via INTRAVENOUS

## 2018-06-26 MED ORDER — HYDROMORPHONE HCL 1 MG/ML IJ SOLN
INTRAMUSCULAR | Status: AC
  Administered 2018-06-26: 0.25 mg via INTRAVENOUS
  Filled 2018-06-26: qty 1

## 2018-06-26 MED ORDER — ONDANSETRON HCL 4 MG/2ML IJ SOLN
INTRAMUSCULAR | Status: DC | PRN
  Administered 2018-06-26: 4 mg via INTRAVENOUS

## 2018-06-26 MED ORDER — MIDAZOLAM HCL 2 MG/2ML IJ SOLN
INTRAMUSCULAR | Status: AC
  Filled 2018-06-26: qty 2

## 2018-06-26 MED ORDER — HYDROMORPHONE HCL 1 MG/ML IJ SOLN
0.2500 mg | INTRAMUSCULAR | Status: DC | PRN
  Administered 2018-06-26 (×2): 0.25 mg via INTRAVENOUS

## 2018-06-26 MED ORDER — PROMETHAZINE HCL 25 MG/ML IJ SOLN: 6.2500 mg | INTRAMUSCULAR | Status: DC | PRN

## 2018-06-26 MED ORDER — 0.9 % SODIUM CHLORIDE (POUR BTL) OPTIME
TOPICAL | Status: DC | PRN
  Administered 2018-06-26: 1000 mL

## 2018-06-26 SURGICAL SUPPLY — 35 items
BLADE SURG 21 STRL SS (BLADE) ×3 IMPLANT
BNDG COHESIVE 6X5 TAN STRL LF (GAUZE/BANDAGES/DRESSINGS) ×1 IMPLANT
BNDG GAUZE ELAST 4 BULKY (GAUZE/BANDAGES/DRESSINGS) ×4 IMPLANT
COVER SURGICAL LIGHT HANDLE (MISCELLANEOUS) ×4 IMPLANT
DRAPE INCISE IOBAN 66X45 STRL (DRAPES) ×1 IMPLANT
DRAPE U-SHAPE 47X51 STRL (DRAPES) ×3 IMPLANT
DRSG ADAPTIC 3X8 NADH LF (GAUZE/BANDAGES/DRESSINGS) ×2 IMPLANT
DURAPREP 26ML APPLICATOR (WOUND CARE) ×3 IMPLANT
ELECT REM PT RETURN 9FT ADLT (ELECTROSURGICAL) ×3
ELECTRODE REM PT RTRN 9FT ADLT (ELECTROSURGICAL) ×1 IMPLANT
GAUZE SPONGE 4X4 12PLY STRL (GAUZE/BANDAGES/DRESSINGS) ×2 IMPLANT
GLOVE BIOGEL PI IND STRL 9 (GLOVE) ×2 IMPLANT
GLOVE BIOGEL PI INDICATOR 9 (GLOVE) ×1
GLOVE SURG ORTHO 9.0 STRL STRW (GLOVE) ×3 IMPLANT
GOWN STRL REUS W/ TWL XL LVL3 (GOWN DISPOSABLE) ×4 IMPLANT
GOWN STRL REUS W/TWL XL LVL3 (GOWN DISPOSABLE) ×6
HANDPIECE INTERPULSE COAX TIP (DISPOSABLE)
IMMOBILIZER KNEE 20 (SOFTGOODS) ×3
IMMOBILIZER KNEE 20 THIGH 36 (SOFTGOODS) ×1 IMPLANT
KIT BASIN OR (CUSTOM PROCEDURE TRAY) ×3 IMPLANT
KIT DRSG PREVENA PLUS 7DAY 125 (MISCELLANEOUS) ×1 IMPLANT
KIT PREVENA INCISION MGT 13 (CANNISTER) ×2 IMPLANT
KIT TURNOVER KIT B (KITS) ×3 IMPLANT
MANIFOLD NEPTUNE II (INSTRUMENTS) ×3 IMPLANT
NS IRRIG 1000ML POUR BTL (IV SOLUTION) ×3 IMPLANT
PACK ORTHO EXTREMITY (CUSTOM PROCEDURE TRAY) ×3 IMPLANT
PAD ARMBOARD 7.5X6 YLW CONV (MISCELLANEOUS) ×6 IMPLANT
SET HNDPC FAN SPRY TIP SCT (DISPOSABLE) IMPLANT
STOCKINETTE IMPERVIOUS 9X36 MD (GAUZE/BANDAGES/DRESSINGS) IMPLANT
SUT ETHILON 2 0 PSLX (SUTURE) ×2 IMPLANT
SWAB COLLECTION DEVICE MRSA (MISCELLANEOUS) ×2 IMPLANT
SWAB CULTURE ESWAB REG 1ML (MISCELLANEOUS) IMPLANT
TOWEL OR 17X26 10 PK STRL BLUE (TOWEL DISPOSABLE) ×3 IMPLANT
TUBE CONNECTING 12X1/4 (SUCTIONS) ×3 IMPLANT
YANKAUER SUCT BULB TIP NO VENT (SUCTIONS) ×3 IMPLANT

## 2018-06-26 NOTE — H&P (Signed)
Debra Sanders is an 62 y.o. female.   Chief Complaint: Prepatellar bursitis right knee. HPI: Patient is a 63 year old woman who presents in referral from Dr. Madelon Lips for a septic prepatellar bursitis right knee.  Patient is currently on doxycycline and Keflex she has had the wound aspirated but we do not have cultures she has had radiographs these are reviewed.  Patient states she is an uncontrolled type II diabetic she is unaware of her hemoglobin A1c.  Past Medical History:  Diagnosis Date  . Atrial fibrillation (HCC) 2011   from ED admission - pt doesn't remember being diagnosed with this.  . Diabetes mellitus without complication (HCC)   . Hypertension   . 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 CESAREAN SECTION      x 2  . EYE SURGERY Bilateral    cataract surgery with lens implant     History reviewed. No pertinent family history. Social History:  reports that she is a non-smoker but has been exposed to tobacco smoke. She has never used smokeless tobacco. She reports that she does not drink alcohol or use drugs.  Allergies:  Allergies  Allergen Reactions  . Penicillins Other (See Comments)    Pt unsure whether she is allergic or not Has patient had a PCN reaction causing immediate rash, facial/tongue/throat swelling, SOB or lightheadedness with hypotension: n/a Has patient had a PCN reaction causing severe rash involving mucus membranes or skin necrosis: n/a Has patient had a PCN reaction that required hospitalization: n/a Has patient had a PCN reaction occurring within the last 10 years: n/a If all of the above answers are "NO", then may proceed with Cephalosporin use.     No medications prior to admission.    No results found for this or any previous visit (from the past 48 hour(s)). No results found.  Review of Systems  All other systems reviewed and are negative.   There were no vitals taken for this visit. Physical  Exam  Patient is alert, oriented, no adenopathy, well-dressed, normal affect, normal respiratory effort. Examination patient has a good dorsalis pedis pulse she does have venous stasis swelling with pitting edema but no open ulcers.  She has a red swollen prepatellar bursa with an open wound about 3 x 1 mm.  There is no purulent drainage but there is bloody drainage.  There is no effusion within the knee palpation around the knee is nontender she is only tender to palpation over the prepatellar bursa.  Review of the radiographs shows no bony abnormalities no signs of chronic osteomyelitis. Assessment/Plan 1. Septic prepatellar bursitis of right knee     Plan: We will plan for surgery tomorrow for excision of the prepatellar bursa if this does not appear to affect the knee joint.  Will plan on final antibiotic treatment pending culture results.  Risks and benefits were discussed with the patient and her mother they agree and wish to proceed with surgery at this time.   Nadara Mustard, MD 06/26/2018, 6:43 AM

## 2018-06-26 NOTE — Transfer of Care (Signed)
Immediate Anesthesia Transfer of Care Note  Patient: Debra Sanders  Procedure(s) Performed: EXCISION PREPATELLA BURSA RIGHT KNEE (Right ) APPLICATION OF PREVENA WOUND VAC (Right Knee)  Patient Location: PACU  Anesthesia Type:General  Level of Consciousness: awake, alert  and oriented  Airway & Oxygen Therapy: Patient Spontanous Breathing and Patient connected to face mask oxygen  Post-op Assessment: Report given to RN, Post -op Vital signs reviewed and stable, Patient moving all extremities X 4 and Patient able to stick tongue midline  Post vital signs: Reviewed and stable  Last Vitals:  Vitals Value Taken Time  BP 188/94 06/26/2018 12:38 PM  Temp    Pulse 80 06/26/2018 12:40 PM  Resp 17 06/26/2018 12:40 PM  SpO2 95 % 06/26/2018 12:40 PM  Vitals shown include unvalidated device data.  Last Pain:  Vitals:   06/26/18 1109  TempSrc: Oral  PainSc: 0-No pain      Patients Stated Pain Goal: 0 (06/26/18 1109)  Complications: No apparent anesthesia complications

## 2018-06-26 NOTE — Anesthesia Procedure Notes (Signed)
Procedure Name: LMA Insertion Date/Time: 06/26/2018 12:00 PM Performed by: Trevor Iha, MD Pre-anesthesia Checklist: Patient identified, Suction available, Emergency Drugs available, Patient being monitored and Timeout performed Patient Re-evaluated:Patient Re-evaluated prior to induction Oxygen Delivery Method: Circle system utilized Preoxygenation: Pre-oxygenation with 100% oxygen Induction Type: IV induction Ventilation: Mask ventilation without difficulty LMA: LMA inserted LMA Size: 4.0 Number of attempts: 1 Placement Confirmation: positive ETCO2 and breath sounds checked- equal and bilateral Tube secured with: Tape

## 2018-06-26 NOTE — Op Note (Signed)
06/26/2018  1:59 PM  PATIENT:  Debra Sanders    PRE-OPERATIVE DIAGNOSIS:  Septic Prepatella Bursitis  POST-OPERATIVE DIAGNOSIS:  Same  PROCEDURE:  EXCISION PREPATELLA BURSA RIGHT KNEE, APPLICATION OF PREVENA WOUND VAC local tissue rearrangement for wound closure 3 x 10 cm.  SURGEON:  Nadara Mustard, MD  PHYSICIAN ASSISTANT:None ANESTHESIA:   General  PREOPERATIVE INDICATIONS:  Debra Sanders is a  62 y.o. female with a diagnosis of Septic Prepatella Bursitis who failed conservative measures and elected for surgical management.    The risks benefits and alternatives were discussed with the patient preoperatively including but not limited to the risks of infection, bleeding, nerve injury, cardiopulmonary complications, the need for revision surgery, among others, and the patient was willing to proceed.  OPERATIVE IMPLANTS: Praveena wound VAC 13 cm  @ENCIMAGES @  OPERATIVE FINDINGS: Large hematoma in the prepatellar bursa.  Tissue sent for cultures.  OPERATIVE PROCEDURE:   Patient was brought to the operating room and underwent a general anesthetic.  After adequate levels of anesthesia were obtained patient's right lower extremity was prepped using DuraPrep draped into a sterile field a timeout was called.  An elliptical incision was made around the ulcerative tissue this left a wound 10 x 3 cm.  The bursa and the congealed hematoma were resected and sent for cultures.  Electrocautery was used for hemostasis the wound was irrigated with normal saline.  Local tissue rearrangement was used to close the wound 10 x 3 cm.  A Praveena wound VAC was applied this had a good suction fit a knee immobilizer was placed patient had venous swelling and her leg was wrapped with a Covan wrap.  Patient was extubated taken to PACU in stable condition.   DISCHARGE PLANNING:  Antibiotic duration: Patient will continue doxycycline we will change this pending the culture results  Weightbearing: Weightbearing  as tolerated with a knee immobilizer.  Pain medication: Prescription for Vicodin.  Dressing care/ Wound VAC: Wound VAC for 1 week  Ambulatory devices: Walker or crutches  Discharge to: Home.  Follow-up: In the office 1 week post operative.

## 2018-06-26 NOTE — Progress Notes (Signed)
Paged pharmacy x2 to review patient's medications.  Reviewed medications with patient. Awaiting pharmacy

## 2018-06-26 NOTE — Anesthesia Preprocedure Evaluation (Addendum)
Anesthesia Evaluation  Patient identified by MRN, date of birth, ID band Patient awake    Reviewed: Allergy & Precautions, NPO status , Patient's Chart, lab work & pertinent test results  Airway Mallampati: II  TM Distance: >3 FB Neck ROM: Full    Dental no notable dental hx. (+) Teeth Intact, Dental Advisory Given   Pulmonary neg pulmonary ROS,    Pulmonary exam normal breath sounds clear to auscultation       Cardiovascular hypertension, Pt. on medications and Pt. on home beta blockers Normal cardiovascular exam+ dysrhythmias  Rhythm:Regular Rate:Normal     Neuro/Psych L eye CVA, Residual Symptoms    GI/Hepatic negative GI ROS, Neg liver ROS,   Endo/Other  diabetes, Type 2, Oral Hypoglycemic Agents  Renal/GU      Musculoskeletal   Abdominal   Peds  Hematology negative hematology ROS (+)   Anesthesia Other Findings   Reproductive/Obstetrics                            Lab Results  Component Value Date   CREATININE 0.83 11/29/2016   BUN 13 11/29/2016   NA 125 (L) 11/29/2016   K 3.2 (L) 11/29/2016   CL 88 (L) 11/29/2016   CO2 26 11/29/2016    Lab Results  Component Value Date   WBC 12.8 (H) 11/29/2016   HGB 14.2 11/29/2016   HCT 40.1 11/29/2016   MCV 86.8 11/29/2016   PLT 289 11/29/2016    Anesthesia Physical Anesthesia Plan  ASA: III  Anesthesia Plan: General   Post-op Pain Management:    Induction: Intravenous  PONV Risk Score and Plan: Treatment may vary due to age or medical condition, Ondansetron and Dexamethasone  Airway Management Planned: LMA  Additional Equipment:   Intra-op Plan:   Post-operative Plan: Extubation in OR  Informed Consent: I have reviewed the patients History and Physical, chart, labs and discussed the procedure including the risks, benefits and alternatives for the proposed anesthesia with the patient or authorized representative who has  indicated his/her understanding and acceptance.   Dental advisory given  Plan Discussed with:   Anesthesia Plan Comments:         Anesthesia Quick Evaluation

## 2018-06-26 NOTE — Anesthesia Postprocedure Evaluation (Signed)
Anesthesia Post Note  Patient: Debra Sanders  Procedure(s) Performed: EXCISION PREPATELLA BURSA RIGHT KNEE (Right ) APPLICATION OF PREVENA WOUND VAC (Right Knee)     Patient location during evaluation: PACU Anesthesia Type: General Level of consciousness: awake and alert Pain management: pain level controlled Vital Signs Assessment: post-procedure vital signs reviewed and stable Respiratory status: spontaneous breathing, nonlabored ventilation, respiratory function stable and patient connected to nasal cannula oxygen Cardiovascular status: blood pressure returned to baseline and stable Postop Assessment: no apparent nausea or vomiting Anesthetic complications: no    Last Vitals:  Vitals:   06/26/18 1341 06/26/18 1349  BP: 134/79 117/61  Pulse: 79 67  Resp:    Temp:    SpO2: 100% 99%    Last Pain:  Vitals:   06/26/18 1333  TempSrc:   PainSc: Asleep                 Trevor Iha

## 2018-06-27 ENCOUNTER — Encounter (HOSPITAL_COMMUNITY): Payer: Self-pay | Admitting: Orthopedic Surgery

## 2018-07-01 LAB — AEROBIC/ANAEROBIC CULTURE W GRAM STAIN (SURGICAL/DEEP WOUND): Culture: NO GROWTH

## 2018-07-01 LAB — AEROBIC/ANAEROBIC CULTURE (SURGICAL/DEEP WOUND)

## 2018-07-03 ENCOUNTER — Encounter (INDEPENDENT_AMBULATORY_CARE_PROVIDER_SITE_OTHER): Payer: Self-pay | Admitting: Physician Assistant

## 2018-07-03 ENCOUNTER — Ambulatory Visit (INDEPENDENT_AMBULATORY_CARE_PROVIDER_SITE_OTHER): Payer: BLUE CROSS/BLUE SHIELD | Admitting: Physician Assistant

## 2018-07-03 VITALS — Ht 64.0 in | Wt 155.0 lb

## 2018-07-03 DIAGNOSIS — Z794 Long term (current) use of insulin: Secondary | ICD-10-CM

## 2018-07-03 DIAGNOSIS — M71161 Other infective bursitis, right knee: Secondary | ICD-10-CM

## 2018-07-03 DIAGNOSIS — E11628 Type 2 diabetes mellitus with other skin complications: Secondary | ICD-10-CM

## 2018-07-03 NOTE — Progress Notes (Signed)
Office Visit Note   Patient: Debra Sanders           Date of Birth: Dec 23, 1955           MRN: [id number] Visit Date: 07/03/2018              Requested by: Georgianne Fick, MD [address] [place], [place] [address] PCP: Georgianne Fick, MD  Chief Complaint  Patient presents with  . Right Knee - Routine Post Op    06/26/18 excision prepatella burasa      HPI: The patient is a 62 year old female who is seen for postoperative follow-up following excision of the prepatellar bursa of the right knee due to septic bursitis and placement of a Praveena wound VAC to the incisional area on 06/26/2018.  She is 1 week postop.  She was maintained in a knee immobilizer but weightbearing as tolerated with crutches or walker.  Operative cultures have shown no growth.  She has been on doxycycline for perioperative treatment.  Assessment & Plan: Visit Diagnoses:  1. Septic prepatellar bursitis of right knee   2. Type 2 diabetes mellitus with other skin complication, with long-term current use of insulin (HCC)     Plan: The Praveena VAC was removed.  The patient may wash the area with Dial soap and water and apply gauze and ABD pad and Ace wrap for edema control.  She will continue her doxycycline until the course is finished.  She can continue ice if she desires.  She can weight-bear as tolerated in her knee immobilizer ambulating with crutches or walker.  She will follow-up in 2 weeks.   Follow-Up Instructions: Return in about 2 weeks (around 07/17/2018).   Ortho Exam  Patient is alert, oriented, no adenopathy, well-dressed, normal affect, normal respiratory effort. The Praveena VAC was removed.  The right knee incision is intact with sutures in place but she does have a lot of peri-incisional bruising.  There are no signs of cellulitis.  Imaging: No results found. No images are attached to the encounter.  Labs: Lab Results  Component Value Date   REPTSTATUS  07/01/2018 FINAL 06/26/2018   GRAMSTAIN  06/26/2018    FEW WBC PRESENT, PREDOMINANTLY PMN NO ORGANISMS SEEN    CULT  06/26/2018    No growth aerobically or anaerobically. Performed at [facility], [address], [place], [place] [address]      Lab Results  Component Value Date   ALBUMIN 4.2 11/29/2016    Body mass index is 26.61 kg/m.  Orders:  No orders of the defined types were placed in this encounter.  No orders of the defined types were placed in this encounter.    Procedures: No procedures performed  Clinical Data: No additional findings.  ROS:  All other systems negative, except as noted in the HPI. Review of Systems  Objective: Vital Signs: Ht 5\' 4"  (1.626 m)   Wt 155 lb (70.3 kg)   BMI 26.61 kg/m   Specialty Comments:  No specialty comments available.  PMFS History: Patient Active Problem List   Diagnosis Date Noted  . Septic prepatellar bursitis of right knee 06/25/2018   Past Medical History:  Diagnosis Date  . Atrial fibrillation (HCC) 2011   from ED admission - pt doesn't remember being diagnosed with this.  . Diabetes mellitus without complication (HCC)   . Hypertension   . 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 No pertinent family history.  Past Surgical History:  Procedure Laterality Date  . APPLICATION OF WOUND VAC Right 06/26/2018   Procedure: APPLICATION OF PREVENA WOUND VAC;  Surgeon: Nadara Mustard, MD;  Location: [facility] OR;  Service: Orthopedics;  Laterality: Right;  . CESAREAN SECTION      x 2  . EYE SURGERY Bilateral    cataract surgery with lens implant   . I&D EXTREMITY Right 06/26/2018   Procedure: EXCISION PREPATELLA BURSA RIGHT KNEE;  Surgeon: Nadara Mustard, MD;  Location: [facility] OR;  Service: Orthopedics;  Laterality: Right;   Social History   Occupational History  . Not on file  Tobacco Use  . Smoking status: Passive Smoke Exposure - Never Smoker  . Smokeless tobacco: Never Used  Substance and Sexual  Activity  . Alcohol use: No  . Drug use: No  . Sexual activity: Not on file

## 2018-07-07 ENCOUNTER — Encounter (HOSPITAL_COMMUNITY): Payer: Self-pay | Admitting: Orthopedic Surgery

## 2018-07-07 NOTE — Addendum Note (Signed)
Addendum  created 07/07/18 0820 by Trevor Iha, MD   Intraprocedure Event edited, Intraprocedure Staff edited

## 2018-07-20 ENCOUNTER — Encounter (INDEPENDENT_AMBULATORY_CARE_PROVIDER_SITE_OTHER): Payer: Self-pay | Admitting: Physician Assistant

## 2018-07-20 ENCOUNTER — Ambulatory Visit (INDEPENDENT_AMBULATORY_CARE_PROVIDER_SITE_OTHER): Payer: BLUE CROSS/BLUE SHIELD | Admitting: Physician Assistant

## 2018-07-20 VITALS — Ht 64.0 in | Wt 155.0 lb

## 2018-07-20 DIAGNOSIS — M71161 Other infective bursitis, right knee: Secondary | ICD-10-CM

## 2018-07-20 DIAGNOSIS — Z794 Long term (current) use of insulin: Secondary | ICD-10-CM

## 2018-07-20 DIAGNOSIS — I872 Venous insufficiency (chronic) (peripheral): Secondary | ICD-10-CM

## 2018-07-20 DIAGNOSIS — E11628 Type 2 diabetes mellitus with other skin complications: Secondary | ICD-10-CM

## 2018-07-20 NOTE — Progress Notes (Signed)
Office Visit Note   Patient: Debra Sanders           Date of Birth: 28-Oct-1955           MRN: [id number] Visit Date: 07/20/2018              Requested by: Georgianne Fick, MD [address] [place], [place] [address] PCP: Georgianne Fick, MD  Chief Complaint  Patient presents with  . Right Knee - Routine Post Op      HPI: Patient is a 62 year old female who is seen for postoperative follow-up following irrigation and debridement of her right prepatellar bursa for infection on 06/26/2018.  She reports she has been doing well at home.  She does note that she is very stiff in her knee and continues to wear her knee immobilizer as instructed.  She has finished all her antibiotics.  She comes in today with her daughter-in-law for follow-up.  Assessment & Plan: Visit Diagnoses:  1. Septic prepatellar bursitis of right knee   2. Type 2 diabetes mellitus with other skin complication, with long-term current use of insulin (HCC)   3. Venous stasis dermatitis of both lower extremities     Plan: Sutures were harvested.  The patient may begin physical therapy with range of motion strengthening progression with gait out of her knee immobilizer.  Begin silver compression stockings for bilateral lower extremity edema.  She will follow-up here in 3 weeks.  Follow-Up Instructions: Return in about 3 weeks (around 08/10/2018).   Ortho Exam  Patient is alert, oriented, no adenopathy, well-dressed, normal affect, normal respiratory effort. Right knee incision was clean dry and intact.  Sutures were harvested this visit.  She has full knee extension, only 30 degrees of flexion actively.  There is no signs of cellulitis.  She does have edema of both lower extremities which is pitting slightly worse on the right than the left and we are going to start compression stockings for this.  Imaging: No results found. No images are attached to the encounter.  Labs: Lab Results    Component Value Date   REPTSTATUS 07/01/2018 FINAL 06/26/2018   GRAMSTAIN  06/26/2018    FEW WBC PRESENT, PREDOMINANTLY PMN NO ORGANISMS SEEN    CULT  06/26/2018    No growth aerobically or anaerobically. Performed at [facility], [address] [place], [place] [address]      Lab Results  Component Value Date   ALBUMIN 4.2 11/29/2016    Body mass index is 26.61 kg/m.  Orders:  Orders Placed This Encounter  Procedures  . Ambulatory referral to Physical Therapy   No orders of the defined types were placed in this encounter.    Procedures: No procedures performed  Clinical Data: No additional findings.  ROS:  All other systems negative, except as noted in the HPI. Review of Systems  Objective: Vital Signs: Ht 5\' 4"  (1.626 m)   Wt 155 lb (70.3 kg)   BMI 26.61 kg/m   Specialty Comments:  No specialty comments available.  PMFS History: Patient Active Problem List   Diagnosis Date Noted  . Septic prepatellar bursitis of right knee 06/25/2018   Past Medical History:  Diagnosis Date  . Atrial fibrillation (HCC) 2011   from ED admission - pt doesn't remember being diagnosed with this.  . Diabetes mellitus without complication (HCC)   . Hypertension   . 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 No pertinent  family history.  Past Surgical History:  Procedure Laterality Date  . APPLICATION OF WOUND VAC Right 06/26/2018   Procedure: APPLICATION OF PREVENA WOUND VAC;  Surgeon: Nadara Mustard, MD;  Location: [facility] OR;  Service: Orthopedics;  Laterality: Right;  . CESAREAN SECTION      x 2  . EYE SURGERY Bilateral    cataract surgery with lens implant   . I&D EXTREMITY Right 06/26/2018   Procedure: EXCISION PREPATELLA BURSA RIGHT KNEE;  Surgeon: Nadara Mustard, MD;  Location: [facility] OR;  Service: Orthopedics;  Laterality: Right;   Social History   Occupational History  . Not on file  Tobacco Use  . Smoking status: Passive Smoke Exposure - Never  Smoker  . Smokeless tobacco: Never Used  Substance and Sexual Activity  . Alcohol use: No  . Drug use: No  . Sexual activity: Not on file

## 2018-08-04 ENCOUNTER — Ambulatory Visit: Payer: BLUE CROSS/BLUE SHIELD | Attending: Physician Assistant | Admitting: Physical Therapy

## 2018-08-04 ENCOUNTER — Encounter: Payer: Self-pay | Admitting: Physical Therapy

## 2018-08-04 DIAGNOSIS — M25661 Stiffness of right knee, not elsewhere classified: Secondary | ICD-10-CM | POA: Diagnosis present

## 2018-08-04 DIAGNOSIS — M25561 Pain in right knee: Secondary | ICD-10-CM

## 2018-08-04 DIAGNOSIS — R262 Difficulty in walking, not elsewhere classified: Secondary | ICD-10-CM | POA: Diagnosis present

## 2018-08-04 DIAGNOSIS — R600 Localized edema: Secondary | ICD-10-CM | POA: Diagnosis present

## 2018-08-04 NOTE — Therapy (Signed)
[facility] [address] [place], [place], [address] Phone: [phone]   Fax:  [phone]  Physical Therapy Evaluation  Patient Details  Name: Debra Sanders MRN: [id number] Date of Birth: 02-02-1956 Referring Provider (PT): Bettey Mare Date: 08/04/2018  PT End of Session - 08/04/18 1335    Visit Number  1    Date for PT Re-Evaluation  10/04/18    PT Start Time  1310    PT Stop Time  1350    PT Time Calculation (min)  40 min    Activity Tolerance  Patient tolerated treatment well    Behavior During Therapy  [facility] for tasks assessed/performed       Past Medical History:  Diagnosis Date  . Atrial fibrillation (HCC) 2011   from ED admission - pt doesn't remember being diagnosed with this.  . Diabetes mellitus without complication (HCC)   . Hypertension   . 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 APPLICATION OF WOUND VAC Right 06/26/2018   Procedure: APPLICATION OF PREVENA WOUND VAC;  Surgeon: Nadara Mustard, MD;  Location: [facility] OR;  Service: Orthopedics;  Laterality: Right;  . CESAREAN SECTION      x 2  . EYE SURGERY Bilateral    cataract surgery with lens implant   . I&D EXTREMITY Right 06/26/2018   Procedure: EXCISION PREPATELLA BURSA RIGHT KNEE;  Surgeon: Nadara Mustard, MD;  Location: [facility] OR;  Service: Orthopedics;  Laterality: Right;    There were no vitals filed for this visit.   Subjective Assessment - 08/04/18 1316    Subjective  Patient reports that she had an infection in the right knee prepatellar bursa.  She reports a fall in august.  She ended up having I&D and bursa excision on 06/26/18.  She reports that she has not been doing any exercises.  She c/o stiffness and tighness and difficulty walking    Pertinent History  CVA, DM, Afib    Limitations  Standing;Walking;House hold activities    Patient Stated Goals  have better motions, less difficulty  walking    Currently in Pain?  Yes    Pain Score  2     Pain Location  Knee    Pain Orientation  Right    Pain Descriptors / Indicators  Aching;Sore;Tightness    Pain Type  Acute pain    Pain Onset  More than a month ago    Pain Frequency  Constant    Aggravating Factors   bending pain can be 5-6/10    Pain Relieving Factors  rest    Effect of Pain on Daily Activities  c/o swelling, difficulty walking and bending         [facility] PT Assessment - 08/04/18 0001      Assessment   Medical Diagnosis  s/p left knee prepatellar bursectomy    Referring Provider (PT)  Lajoyce Corners    Onset Date/Surgical Date  06/26/18    Prior Therapy  no      Precautions   Precautions  None      Balance Screen   Has the patient fallen in the past 6 months  Yes    How many times?  1    Has the patient had a decrease in activity level because of a fear of falling?   No    Is the patient reluctant to leave their  home because of a fear of falling?   No      Home Environment   Additional Comments  has some steps into the home, does some housework      Prior Function   Level of Independence  Independent    Vocation  Part time employment    Vocation Requirements  mostly sitting    Leisure  no exercise      Observation/Other Assessments-Edema    Edema  Circumferential      Circumferential Edema   Circumferential - Right  45.5 cm    Circumferential - Left   41.5 cm      ROM / Strength   AROM / PROM / Strength  AROM;PROM;Strength      AROM   AROM Assessment Site  Knee    Right/Left Knee  Right    Right Knee Extension  14    Right Knee Flexion  65      PROM   Overall PROM Comments  very tight, did not c/o a lot of pain    PROM Assessment Site  Knee    Right/Left Knee  Right    Right Knee Extension  9    Right Knee Flexion  73      Strength   Overall Strength Comments  4-/5       Palpation   Palpation comment  the tissue of the right knee seems dense, the scar is very tight, lacks mobility of  the skin and scar around the knee.      Ambulation/Gait   Gait Comments  no device, very stiff gait, does not bend the knee, antalgic and slow on the left      Standardized Balance Assessment   Standardized Balance Assessment  Timed Up and Go Test      Timed Up and Go Test   Normal TUG (seconds)  19                Objective measurements completed on examination: See above findings.      [facility] Adult PT Treatment/Exercise - 08/04/18 0001      Exercises   Exercises  Knee/Hip      Knee/Hip Exercises: Aerobic   Nustep  level 4 x 6 minutes             PT Education - 08/04/18 1334    Education Details  low load long duration for flexion    Person(s) Educated  Patient    Methods  Explanation;Demonstration;Tactile cues;Verbal cues;Handout    Comprehension  Verbalized understanding;Returned demonstration;Verbal cues required;Tactile cues required;Need further instruction       PT Short Term Goals - 08/04/18 1344      PT SHORT TERM GOAL #1   Title  indepednent with intiial HEP    Time  2    Period  Weeks    Status  New        PT Long Term Goals - 08/04/18 1344      PT LONG TERM GOAL #1   Title  decrease edema 2 cm    Time  8    Period  Weeks    Status  New      PT LONG TERM GOAL #2   Title  increase AROM of knee flexion to 100 degrees    Time  8    Period  Weeks    Status  New      PT LONG TERM GOAL #3   Title  go step over  step on her entry into the home    Time  8    Period  Weeks    Status  New      PT LONG TERM GOAL #4   Title  walk with good gait pattern and minimal deviation    Time  8    Period  Weeks    Status  New             Plan - 08/04/18 1335    Clinical Impression Statement  Patietn reports a fall in August and injuring the right knee, she reports that it was found to have an infection in the bursa, she underwent I&D on the right knee on 06/26/18, she reports that she was put in a brace that did not allow the knee to  bend, she is very stiff , walks stiff legged, does not bend the knee, her AROm was to 65 degrees flexion, the density of the tissue has changed, it is dense and especially the scar    History and Personal Factors relevant to plan of care:  CVA, afib, DM    Clinical Presentation  Evolving    Clinical Decision Making  Moderate    Rehab Potential  Fair    PT Frequency  2x / week    PT Duration  8 weeks    PT Treatment/Interventions  ADLs/Self Care Home Management;Cryotherapy;Electrical Stimulation;Gait training;Moist Heat;Stair training;Functional mobility training;Ultrasound;Therapeutic activities;Therapeutic exercise;Balance training;Patient/family education;Manual techniques;Vasopneumatic Device    PT Next Visit Plan  slowly work on her ROM    Consulted and Agree with Plan of Care  Patient       Patient will benefit from skilled therapeutic intervention in order to improve the following deficits and impairments:  Abnormal gait, Decreased coordination, Decreased range of motion, Difficulty walking, Increased muscle spasms, Decreased activity tolerance, Pain, Decreased balance, Decreased scar mobility, Impaired flexibility, Increased edema, Decreased strength, Decreased mobility  Visit Diagnosis: Acute pain of right knee - Plan: PT plan of care cert/re-cert  Stiffness of right knee, not elsewhere classified - Plan: PT plan of care cert/re-cert  Difficulty in walking, not elsewhere classified - Plan: PT plan of care cert/re-cert  Localized edema - Plan: PT plan of care cert/re-cert     Problem List Patient Active Problem List   Diagnosis Date Noted  . Septic prepatellar bursitis of right knee 06/25/2018    Jearld Lesch., PT 08/04/2018, 2:05 PM  [facility] [address] [place], [place], [address] Phone: [phone]   Fax:  [phone]  Name: Debra Sanders MRN: [id number] Date of Birth: 15-Apr-1956

## 2018-08-05 ENCOUNTER — Ambulatory Visit: Payer: BLUE CROSS/BLUE SHIELD | Admitting: Physical Therapy

## 2018-08-05 DIAGNOSIS — R600 Localized edema: Secondary | ICD-10-CM

## 2018-08-05 DIAGNOSIS — R262 Difficulty in walking, not elsewhere classified: Secondary | ICD-10-CM

## 2018-08-05 DIAGNOSIS — M25561 Pain in right knee: Secondary | ICD-10-CM

## 2018-08-05 DIAGNOSIS — M25661 Stiffness of right knee, not elsewhere classified: Secondary | ICD-10-CM

## 2018-08-05 NOTE — Therapy (Signed)
[facility] [address] [place], [place], [address] Phone: [phone]   Fax:  [phone]  Physical Therapy Treatment  Patient Details  Name: Debra Sanders MRN: [id number] Date of Birth: [birth year] Referring Provider (PT): Bettey Mare Date: 08/05/2018  PT End of Session - 08/05/18 1438    Visit Number  2    Date for PT Re-Evaluation  10/04/18    PT Start Time  1400    PT Stop Time  1453    PT Time Calculation (min)  53 min    Activity Tolerance  Patient tolerated treatment well    Behavior During Therapy  [facility] for tasks assessed/performed       Past Medical History:  Diagnosis Date  . Atrial fibrillation (HCC) 2011   from ED admission - pt doesn't remember being diagnosed with this.  . Diabetes mellitus without complication (HCC)   . Hypertension   . 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 APPLICATION OF WOUND VAC Right 06/26/2018   Procedure: APPLICATION OF PREVENA WOUND VAC;  Surgeon: Nadara Mustard, MD;  Location: [facility] OR;  Service: Orthopedics;  Laterality: Right;  . CESAREAN SECTION      x 2  . EYE SURGERY Bilateral    cataract surgery with lens implant   . I&D EXTREMITY Right 06/26/2018   Procedure: EXCISION PREPATELLA BURSA RIGHT KNEE;  Surgeon: Nadara Mustard, MD;  Location: [facility] OR;  Service: Orthopedics;  Laterality: Right;    There were no vitals filed for this visit.  Subjective Assessment - 08/05/18 1422    Subjective  Pt relays knee is still very stiff and swollen, medium amount of pain.                       [facility] Adult PT Treatment/Exercise - 08/05/18 0001      Exercises   Exercises  Knee/Hip      Knee/Hip Exercises: Stretches   Passive Hamstring Stretch  Right;2 reps;30 seconds    Other Knee/Hip Stretches  sitting heel slides for flexion off low mat table X 20, standing lunge knee flexon stretch with foot on 6 inch step 5  sec X 20      Knee/Hip Exercises: Aerobic   Nustep  level 4 x 6 minutes      Knee/Hip Exercises: Seated   Long Arc Quad  20 reps;Right    Hamstring Curl  Right;15 reps    Hamstring Limitations  yellow      Modalities   Modalities  Vasopneumatic      Vasopneumatic   Number Minutes Vasopneumatic   15 minutes    Vasopnuematic Location   Knee    Vasopneumatic Pressure  Medium    Vasopneumatic Temperature   medium cold      Manual Therapy   Manual therapy comments  PROM, LLLD stretching, patella mobs, STM               PT Short Term Goals - 08/04/18 1344      PT SHORT TERM GOAL #1   Title  indepednent with intiial HEP    Time  2    Period  Weeks    Status  New        PT Long Term Goals - 08/04/18 1344      PT LONG TERM GOAL #1   Title  decrease  edema 2 cm    Time  8    Period  Weeks    Status  New      PT LONG TERM GOAL #2   Title  increase AROM of knee flexion to 100 degrees    Time  8    Period  Weeks    Status  New      PT LONG TERM GOAL #3   Title  go step over step on her entry into the home    Time  8    Period  Weeks    Status  New      PT LONG TERM GOAL #4   Title  walk with good gait pattern and minimal deviation    Time  8    Period  Weeks    Status  New            Plan - 08/05/18 1439    Clinical Impression Statement  Sesison focused on knee ROM with AROM, AAROM, PROM, and manual therapy to tolerance. She did have improvements in ROM today from eval and was able to ambulate with less stiffness but she still does not flex her knee enough with ambulation. PT will continue to progress ROM and strenght as able.    Rehab Potential  Fair    PT Frequency  2x / week    PT Duration  8 weeks    PT Treatment/Interventions  ADLs/Self Care Home Management;Cryotherapy;Electrical Stimulation;Gait training;Moist Heat;Stair training;Functional mobility training;Ultrasound;Therapeutic activities;Therapeutic exercise;Balance training;Patient/family  education;Manual techniques;Vasopneumatic Device    PT Next Visit Plan  slowly work on her ROM, strength, gait    Consulted and Agree with Plan of Care  Patient       Patient will benefit from skilled therapeutic intervention in order to improve the following deficits and impairments:  Abnormal gait, Decreased coordination, Decreased range of motion, Difficulty walking, Increased muscle spasms, Decreased activity tolerance, Pain, Decreased balance, Decreased scar mobility, Impaired flexibility, Increased edema, Decreased strength, Decreased mobility  Visit Diagnosis: Acute pain of right knee  Stiffness of right knee, not elsewhere classified  Difficulty in walking, not elsewhere classified  Localized edema     Problem List Patient Active Problem List   Diagnosis Date Noted  . Septic prepatellar bursitis of right knee 06/25/2018    April Manson, PT,DPT 08/05/2018, 2:42 PM  [facility] [place] [address] [place], [place], [address] Phone: [phone]   Fax:  [phone]  Name: NADEGE CARRIGER MRN: [id number] Date of Birth: [birth year]

## 2018-08-10 ENCOUNTER — Encounter (INDEPENDENT_AMBULATORY_CARE_PROVIDER_SITE_OTHER): Payer: Self-pay | Admitting: Physician Assistant

## 2018-08-10 ENCOUNTER — Ambulatory Visit (INDEPENDENT_AMBULATORY_CARE_PROVIDER_SITE_OTHER): Payer: BLUE CROSS/BLUE SHIELD | Admitting: Physician Assistant

## 2018-08-10 VITALS — Ht 64.0 in | Wt 155.0 lb

## 2018-08-10 DIAGNOSIS — M71161 Other infective bursitis, right knee: Secondary | ICD-10-CM

## 2018-08-10 NOTE — Progress Notes (Signed)
Office Visit Note   Patient: Debra Sanders           Date of Birth: 29-Aug-1956           MRN: [id number] Visit Date: 08/10/2018              Requested by: Georgianne Fick, MD [address] [place], [place] [address] PCP: Georgianne Fick, MD  Chief Complaint  Patient presents with  . Right Knee - Routine Post Op    06/26/18 right excision infected prepatellar bursa       HPI: Patient presents 6 weeks status post excision of infected prepatellar bursitis right knee.  She is currently wearing compression stockings that go up to the tibial tubercle.  Assessment & Plan: Visit Diagnoses:  1. Septic prepatellar bursitis of right knee     Plan: Recommend she wear the stockings daily recommended extending the stocking above the incision to help decrease swelling along the surgical incision.  Follow-Up Instructions: Return if symptoms worsen or fail to improve.   Ortho Exam  Patient is alert, oriented, no adenopathy, well-dressed, normal affect, normal respiratory effort. Examination patient has decreased venous stasis swelling in her right leg there is swelling around the knee the incision is well-healed there is no drainage no cellulitis no open wound no tenderness to palpation no signs of infection.  The compression stocking was pulled above the surgical incision and patient was able to tolerate this well.  Imaging: No results found. No images are attached to the encounter.  Labs: Lab Results  Component Value Date   REPTSTATUS 07/01/2018 FINAL 06/26/2018   GRAMSTAIN  06/26/2018    FEW WBC PRESENT, PREDOMINANTLY PMN NO ORGANISMS SEEN    CULT  06/26/2018    No growth aerobically or anaerobically. Performed at [facility], [address], [place], [place] [address]      Lab Results  Component Value Date   ALBUMIN 4.2 11/29/2016    Body mass index is 26.61 kg/m.  Orders:  No orders of the defined types were placed in this  encounter.  No orders of the defined types were placed in this encounter.    Procedures: No procedures performed  Clinical Data: No additional findings.  ROS:  All other systems negative, except as noted in the HPI. Review of Systems  Objective: Vital Signs: Ht 5\' 4"  (1.626 m)   Wt 155 lb (70.3 kg)   BMI 26.61 kg/m   Specialty Comments:  No specialty comments available.  PMFS History: Patient Active Problem List   Diagnosis Date Noted  . Septic prepatellar bursitis of right knee 06/25/2018   Past Medical History:  Diagnosis Date  . Atrial fibrillation (HCC) 2011   from ED admission - pt doesn't remember being diagnosed with this.  . Diabetes mellitus without complication (HCC)   . Hypertension   . 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 No pertinent family history.  Past Surgical History:  Procedure Laterality Date  . APPLICATION OF WOUND VAC Right 06/26/2018   Procedure: APPLICATION OF PREVENA WOUND VAC;  Surgeon: Nadara Mustard, MD;  Location: [facility] OR;  Service: Orthopedics;  Laterality: Right;  . CESAREAN SECTION      x 2  . EYE SURGERY Bilateral    cataract surgery with lens implant   . I&D EXTREMITY Right 06/26/2018   Procedure: EXCISION PREPATELLA BURSA RIGHT KNEE;  Surgeon: Nadara Mustard, MD;  Location: [facility];  Service:  Orthopedics;  Laterality: Right;   Social History   Occupational History  . Not on file  Tobacco Use  . Smoking status: Passive Smoke Exposure - Never Smoker  . Smokeless tobacco: Never Used  Substance and Sexual Activity  . Alcohol use: No  . Drug use: No  . Sexual activity: Not on file

## 2018-08-12 ENCOUNTER — Encounter: Payer: Self-pay | Admitting: Physical Therapy

## 2018-08-12 ENCOUNTER — Ambulatory Visit: Payer: BLUE CROSS/BLUE SHIELD | Admitting: Physical Therapy

## 2018-08-12 DIAGNOSIS — R600 Localized edema: Secondary | ICD-10-CM

## 2018-08-12 DIAGNOSIS — R262 Difficulty in walking, not elsewhere classified: Secondary | ICD-10-CM

## 2018-08-12 DIAGNOSIS — M25661 Stiffness of right knee, not elsewhere classified: Secondary | ICD-10-CM

## 2018-08-12 DIAGNOSIS — M25561 Pain in right knee: Secondary | ICD-10-CM

## 2018-08-12 NOTE — Therapy (Signed)
[address] [place], [place], [address] Phone: [phone]   Fax:  [phone]  Physical Therapy Treatment  Patient Details  Name: Debra Sanders MRN: [id number] Date of Birth: [birth year] Referring Provider (PT): Dallie Dad Date: 08/12/2018  PT End of Session - 08/12/18 1101    Visit Number  3    Date for PT Re-Evaluation  10/04/18    PT Start Time  2703    PT Stop Time  1112    PT Time Calculation (min)  57 min    Activity Tolerance  Patient tolerated treatment well    Behavior During Therapy  [facility] for tasks assessed/performed       Past Medical History:  Diagnosis Date  . Atrial fibrillation ([place]) 2011   from ED admission - pt doesn't remember being diagnosed with this.  . Diabetes mellitus without complication ([place])   . Hypertension   . 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 APPLICATION OF WOUND VAC Right 06/26/2018   Procedure: APPLICATION OF PREVENA WOUND VAC;  Surgeon: Newt Minion, MD;  Location: [place];  Service: Orthopedics;  Laterality: Right;  . CESAREAN SECTION      x 2  . EYE SURGERY Bilateral    cataract surgery with lens implant   . I&D EXTREMITY Right 06/26/2018   Procedure: EXCISION PREPATELLA BURSA RIGHT KNEE;  Surgeon: Newt Minion, MD;  Location: [place];  Service: Orthopedics;  Laterality: Right;    There were no vitals filed for this visit.  Subjective Assessment - 08/12/18 1021    Subjective  "When it is cold it freezes up" "Was hurting this morning, It is not hurting now just tight"    Currently in Pain?  No/denies    Pain Location  Knee    Pain Orientation  Right    Pain Descriptors / Indicators  Tightness         [facility] PT Assessment - 08/12/18 0001      AROM   AROM Assessment Site  Knee    Right/Left Knee  Right    Right Knee Extension  3    Right Knee Flexion  100                   [facility]  Adult PT Treatment/Exercise - 08/12/18 0001      Exercises   Exercises  Knee/Hip      Knee/Hip Exercises: Aerobic   Nustep  level 4 x 6 minutes      Knee/Hip Exercises: Standing   Forward Step Up  Right;1 set;5 reps;Hand Hold: 1;Step Height: 6"      Knee/Hip Exercises: Seated   Long Arc Quad  Right;2 sets;10 reps;Weights    Long Arc Quad Weight  2 lbs.    Other Seated Knee/Hip Exercises  Quad set manual resistance RLE x10    Hamstring Curl  Right;2 sets;10 reps    Hamstring Limitations  green      Modalities   Modalities  Vasopneumatic      Vasopneumatic   Number Minutes Vasopneumatic   15 minutes    Vasopnuematic Location   Knee    Vasopneumatic Pressure  Medium    Vasopneumatic Temperature   32      Manual Therapy   Manual Therapy  Passive ROM    Passive ROM  R knee flexion and extension  PT Short Term Goals - 08/12/18 1115      PT SHORT TERM GOAL #1   Title  indepednent with intiial HEP    Status  Partially Met        PT Long Term Goals - 08/12/18 1115      PT LONG TERM GOAL #1   Title  decrease edema 2 cm    Status  On-going      PT LONG TERM GOAL #2   Title  increase AROM of knee flexion to 100 degrees    Status  Achieved      PT LONG TERM GOAL #3   Title  go step over step on her entry into the home    Status  Partially Met            Plan - 08/12/18 1104    Clinical Impression Statement  Pt did well with a progressed treatment session. She has progressed increasing her R knee AROM. Some anxiousness when doing new interventions but she tends to ease up after a few reps. Little to no compensation with 6 inch step ups. PT will continues to progresses ROM and strength to improve functional mobility,    Rehab Potential  Fair    PT Frequency  2x / week    PT Duration  8 weeks    PT Next Visit Plan  slowly work on her ROM, strength, gait       Patient will benefit from skilled therapeutic intervention in order to improve the  following deficits and impairments:  Abnormal gait, Decreased coordination, Decreased range of motion, Difficulty walking, Increased muscle spasms, Decreased activity tolerance, Pain, Decreased balance, Decreased scar mobility, Impaired flexibility, Increased edema, Decreased strength, Decreased mobility  Visit Diagnosis: Acute pain of right knee  Stiffness of right knee, not elsewhere classified  Difficulty in walking, not elsewhere classified  Localized edema     Problem List Patient Active Problem List   Diagnosis Date Noted  . Septic prepatellar bursitis of right knee 06/25/2018    Scot Jun, PTA 08/12/2018, 11:16 AM  [place], [place], [address] Phone: [phone]   Fax:  [phone]  Name: KHRISTIAN SEALS MRN: [id number] Date of Birth: May 11, 1956

## 2018-08-13 ENCOUNTER — Ambulatory Visit: Payer: BLUE CROSS/BLUE SHIELD | Admitting: Physical Therapy

## 2018-08-13 ENCOUNTER — Encounter: Payer: Self-pay | Admitting: Physical Therapy

## 2018-08-13 DIAGNOSIS — M25561 Pain in right knee: Secondary | ICD-10-CM

## 2018-08-13 DIAGNOSIS — M25661 Stiffness of right knee, not elsewhere classified: Secondary | ICD-10-CM

## 2018-08-13 DIAGNOSIS — R600 Localized edema: Secondary | ICD-10-CM

## 2018-08-13 DIAGNOSIS — R262 Difficulty in walking, not elsewhere classified: Secondary | ICD-10-CM

## 2018-08-13 NOTE — Therapy (Signed)
[place] [address] [place], [place], [address] Phone: [phone]   Fax:  [phone]  Physical Therapy Treatment  Patient Details  Name: Debra Sanders MRN: [id number] Date of Birth: 04-06-1956 Referring Provider (PT): Dallie Dad Date: 08/13/2018  PT End of Session - 08/13/18 1105    Visit Number  4    Date for PT Re-Evaluation  10/04/18    PT Start Time  1014    PT Stop Time  1110    PT Time Calculation (min)  56 min    Activity Tolerance  Patient tolerated treatment well    Behavior During Therapy  [facility] for tasks assessed/performed       Past Medical History:  Diagnosis Date  . Atrial fibrillation ([place]) 2011   from ED admission - pt doesn't remember being diagnosed with this.  . Diabetes mellitus without complication ([place])   . Hypertension   . 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 APPLICATION OF WOUND VAC Right 06/26/2018   Procedure: APPLICATION OF PREVENA WOUND VAC;  Surgeon: Newt Minion, MD;  Location: [place];  Service: Orthopedics;  Laterality: Right;  . CESAREAN SECTION      x 2  . EYE SURGERY Bilateral    cataract surgery with lens implant   . I&D EXTREMITY Right 06/26/2018   Procedure: EXCISION PREPATELLA BURSA RIGHT KNEE;  Surgeon: Newt Minion, MD;  Location: [place];  Service: Orthopedics;  Laterality: Right;    There were no vitals filed for this visit.  Subjective Assessment - 08/13/18 1023    Subjective  I am okay today, reports that she can't get it to bend when she walks    Currently in Pain?  No/denies                       [facility] Adult PT Treatment/Exercise - 08/13/18 0001      Ambulation/Gait   Gait Comments  gait worked on an exagerrated bend at first to get her to bend the knee, then had her try to gradually decrease the exaggeration and walk with an easy bend      Exercises   Exercises  Knee/Hip      Knee/Hip Exercises: Aerobic   Nustep  level 4 x 7 minutes      Knee/Hip Exercises: Machines for Strengthening   Cybex Knee Extension  5# smaller ROM 3x5    Cybex Knee Flexion  20# 3x10      Knee/Hip Exercises: Standing   Forward Step Up  Right;1 set;5 reps;Hand Hold: 1;Step Height: 6"    Step Down  Step Height: 4";20 reps      Knee/Hip Exercises: Seated   Other Seated Knee/Hip Exercises  Quad set manual resistance RLE x10      Modalities   Modalities  Vasopneumatic      Vasopneumatic   Number Minutes Vasopneumatic   10 minutes    Vasopnuematic Location   Knee    Vasopneumatic Pressure  Medium    Vasopneumatic Temperature   34      Manual Therapy   Manual Therapy  Passive ROM    Passive ROM  R knee flexion and extension               PT Short Term Goals - 08/13/18 1119      PT SHORT TERM GOAL #1  Title  indepednent with intiial HEP    Status  Partially Met        PT Long Term Goals - 08/12/18 1115      PT LONG TERM GOAL #1   Title  decrease edema 2 cm    Status  On-going      PT LONG TERM GOAL #2   Title  increase AROM of knee flexion to 100 degrees    Status  Achieved      PT LONG TERM GOAL #3   Title  go step over step on her entry into the home    Status  Partially Met            Plan - 08/13/18 1106    Clinical Impression Statement  Patient has a difficulty time with the gait bending the knee, she demonstrated good flecion in sitting but when she goes to walk she walks with a stiff/strainght leg.  With cues to act like she is stepping over objects she did well and was able to decreased to an almost normal gait.      PT Next Visit Plan  continue to work on ROM and gait    Consulted and Agree with Plan of Care  Patient       Patient will benefit from skilled therapeutic intervention in order to improve the following deficits and impairments:  Abnormal gait, Decreased coordination, Decreased range of motion, Difficulty walking, Increased  muscle spasms, Decreased activity tolerance, Pain, Decreased balance, Decreased scar mobility, Impaired flexibility, Increased edema, Decreased strength, Decreased mobility  Visit Diagnosis: Acute pain of right knee  Stiffness of right knee, not elsewhere classified  Difficulty in walking, not elsewhere classified  Localized edema     Problem List Patient Active Problem List   Diagnosis Date Noted  . Septic prepatellar bursitis of right knee 06/25/2018    Sumner Boast., PT 08/13/2018, 11:20 AM  [address] [place], [place], [address] Phone: [phone]   Fax:  [phone]  Name: Debra Sanders MRN: [id number] Date of Birth: 1956/06/04

## 2018-08-20 ENCOUNTER — Ambulatory Visit: Payer: BLUE CROSS/BLUE SHIELD | Admitting: Physical Therapy

## 2018-08-20 DIAGNOSIS — M25561 Pain in right knee: Secondary | ICD-10-CM

## 2018-08-20 DIAGNOSIS — R600 Localized edema: Secondary | ICD-10-CM

## 2018-08-20 DIAGNOSIS — R262 Difficulty in walking, not elsewhere classified: Secondary | ICD-10-CM

## 2018-08-20 DIAGNOSIS — M25661 Stiffness of right knee, not elsewhere classified: Secondary | ICD-10-CM

## 2018-08-20 NOTE — Therapy (Signed)
[place] [address] [place], [place], [address] Phone: [phone]   Fax:  [phone]  Physical Therapy Treatment  Patient Details  Name: Debra Sanders MRN: [id number] Date of Birth: 05/21/1956 Referring Provider (PT): Dallie Dad Date: 08/20/2018  PT End of Session - 08/20/18 1656    Visit Number  5    Date for PT Re-Evaluation  10/04/18    PT Start Time  3762    PT Stop Time  1710    PT Time Calculation (min)  60 min    Activity Tolerance  Patient tolerated treatment well    Behavior During Therapy  [facility] for tasks assessed/performed       Past Medical History:  Diagnosis Date  . Atrial fibrillation ([place]) 2011   from ED admission - pt doesn't remember being diagnosed with this.  . Diabetes mellitus without complication ([place])   . Hypertension   . Stroke [facility])    left eye    Past Surgical History:  Procedure Laterality Date  . APPLICATION OF WOUND VAC Right 06/26/2018   Procedure: APPLICATION OF PREVENA WOUND VAC;  Surgeon: Newt Minion, MD;  Location: [place];  Service: Orthopedics;  Laterality: Right;  . CESAREAN SECTION      x 2  . EYE SURGERY Bilateral    cataract surgery with lens implant   . I&D EXTREMITY Right 06/26/2018   Procedure: EXCISION PREPATELLA BURSA RIGHT KNEE;  Surgeon: Newt Minion, MD;  Location: [place];  Service: Orthopedics;  Laterality: Right;    There were no vitals filed for this visit.  Subjective Assessment - 08/20/18 1610    Subjective  "Im doing ok, just trying to walk right"    Currently in Pain?  No/denies                       [facility] Adult PT Treatment/Exercise - 08/20/18 0001      Ambulation/Gait   Gait Comments  worked on exaggerating knee bend      High Level Balance   High Level Balance Activities  Marching forwards;Side stepping   2 lb weights, x4, High knee side stepping     Exercises   Exercises  Knee/Hip      Knee/Hip  Exercises: Aerobic   Nustep  level 4 x 7 minutes      Knee/Hip Exercises: Machines for Strengthening   Cybex Knee Extension  5# 2x10    Cybex Knee Flexion  20# 3x10      Knee/Hip Exercises: Standing   Forward Step Up  2 sets;10 reps;Step Height: 6";Right   Holding onto machine     Modalities   Modalities  Vasopneumatic      Vasopneumatic   Number Minutes Vasopneumatic   15 minutes    Vasopnuematic Location   Knee    Vasopneumatic Pressure  Medium    Vasopneumatic Temperature   34      Manual Therapy   Manual Therapy  Passive ROM    Passive ROM  R knee flexion and extension               PT Short Term Goals - 08/20/18 1702      PT SHORT TERM GOAL #1   Title  indepednent with intiial HEP    Time  2    Period  Weeks    Status  Achieved        PT Long  Term Goals - 08/12/18 1115      PT LONG TERM GOAL #1   Title  decrease edema 2 cm    Status  On-going      PT LONG TERM GOAL #2   Title  increase AROM of knee flexion to 100 degrees    Status  Achieved      PT LONG TERM GOAL #3   Title  go step over step on her entry into the home    Status  Partially Met            Plan - 08/20/18 1656    Clinical Impression Statement  Pt tolerated progression of exercise well without adverse affects. Pt required close supervision with high level balance activities due to LOBx1 laterally. Pt was able to demonstrate good exaggerated knee flexion with gait. Pt continues to progress towards all goals at this time but is limited due to R knee swelling.     Rehab Potential  Fair    PT Frequency  2x / week    PT Duration  8 weeks    PT Treatment/Interventions  ADLs/Self Care Home Management;Cryotherapy;Electrical Stimulation;Gait training;Moist Heat;Stair training;Functional mobility training;Ultrasound;Therapeutic activities;Therapeutic exercise;Balance training;Patient/family education;Manual techniques;Vasopneumatic Device    PT Next Visit Plan  continue to work on ROM  and gait    Consulted and Agree with Plan of Care  Patient       Patient will benefit from skilled therapeutic intervention in order to improve the following deficits and impairments:  Abnormal gait, Decreased coordination, Decreased range of motion, Difficulty walking, Increased muscle spasms, Decreased activity tolerance, Pain, Decreased balance, Decreased scar mobility, Impaired flexibility, Increased edema, Decreased strength, Decreased mobility  Visit Diagnosis: Acute pain of right knee  Stiffness of right knee, not elsewhere classified  Difficulty in walking, not elsewhere classified  Localized edema     Problem List Patient Active Problem List   Diagnosis Date Noted  . Septic prepatellar bursitis of right knee 06/25/2018    Howell Rucks, SPTA 08/20/2018, 5:03 PM  [address] [address] [place], [place], [address] Phone: [phone]   Fax:  [phone]  Name: Debra Sanders MRN: [id number] Date of Birth: July 29, 1956

## 2018-08-25 ENCOUNTER — Ambulatory Visit: Payer: BLUE CROSS/BLUE SHIELD | Admitting: Physical Therapy

## 2018-08-26 ENCOUNTER — Emergency Department (HOSPITAL_COMMUNITY)
Admission: EM | Admit: 2018-08-26 | Discharge: 2018-08-30 | Disposition: E | Payer: BLUE CROSS/BLUE SHIELD | Attending: Emergency Medicine | Admitting: Emergency Medicine

## 2018-08-26 DIAGNOSIS — I469 Cardiac arrest, cause unspecified: Secondary | ICD-10-CM | POA: Diagnosis present

## 2018-08-26 DIAGNOSIS — Z7722 Contact with and (suspected) exposure to environmental tobacco smoke (acute) (chronic): Secondary | ICD-10-CM | POA: Insufficient documentation

## 2018-08-26 DIAGNOSIS — Z7982 Long term (current) use of aspirin: Secondary | ICD-10-CM | POA: Diagnosis not present

## 2018-08-26 DIAGNOSIS — Z79899 Other long term (current) drug therapy: Secondary | ICD-10-CM | POA: Insufficient documentation

## 2018-08-26 DIAGNOSIS — Z794 Long term (current) use of insulin: Secondary | ICD-10-CM | POA: Diagnosis not present

## 2018-08-26 DIAGNOSIS — E119 Type 2 diabetes mellitus without complications: Secondary | ICD-10-CM | POA: Diagnosis not present

## 2018-08-26 DIAGNOSIS — I10 Essential (primary) hypertension: Secondary | ICD-10-CM | POA: Diagnosis not present

## 2018-08-26 DIAGNOSIS — I4891 Unspecified atrial fibrillation: Secondary | ICD-10-CM | POA: Diagnosis not present

## 2018-08-26 NOTE — Code Documentation (Signed)
Pt arrived with bright red blood from [employer].  Epi given

## 2018-08-26 NOTE — ED Notes (Signed)
Money   290.00 and some change with a cell phone

## 2018-08-26 NOTE — ED Triage Notes (Signed)
Pt arrived by [facility] home  Cardiac arfrsst   pea

## 2018-08-26 NOTE — Code Documentation (Signed)
The pt had chest pain one hour ago  Fib shocked initially 8 epi given pea on monitor   cpr continues

## 2018-08-26 NOTE — ED Notes (Signed)
The patients sister reported that the pts boys were not very caring in the past and was very seldom around.  The pts mother took care of the pt and the pts sister is here and slo helped   Cash in the pts pockets was 280.oo and change  Given to sister patricia parks

## 2018-08-26 NOTE — ED Notes (Signed)
Family at the bedside.

## 2018-08-26 NOTE — Progress Notes (Addendum)
Chaplain responded to a CPR in progress that was called shortly before chaplain arrived.  Provided comfort care, emotional and grief support to two adult sons Francee Piccolo(Brandt and McGrewBrandon) and patient's sister Dennie Bible(Pat) and brother in Social worker[place].  Spoke briefly on the phone to patient's mother Preston Fleeting(Betty Matthews), per request by son.  Sons lived with mother; death was unexpected at this time; sons are experiencing extreme shock and grief.  Chaplain provided information about bereavement counseling through the [facility] [place].    Patient's mother stated that she had made all the arrangements for [facility]'s [facility] expenses [facility] [place], [place] ([address], [place], [place] [id number])    Please contact as needed for further care.   Belia HemanKristina N Aleeah Greeno, [place]plain [phone]  Chaplain appreciated environmental staff member Esperanza's timely and heartfelt offering of tissues and then water to family in a moment of heightened emotional distress.     04/16/18 2300  Clinical Encounter Type  Visit Type Initial;Psychological support;Death  Referral From Nurse  Consult/Referral To Chaplain  Stress Factors  Family Stress Factors Loss of control;Major life changes;Loss;Family relationships

## 2018-08-26 NOTE — ED Provider Notes (Addendum)
[facility] Note   CSN: [id number] Arrival date & time: 08/08/2018  2102     History   Chief Complaint Chief Complaint  Patient presents with  . Cardiac Arrest    HPI Debra StairsJudith E Ryden is a 62 y.o. female.  Patient presents s/p cardioresp arrest. Per ems report, pt had c/o chest pain earlier in evening approximately 1 hour prior to being found by family unresponsive. On EMS arrival pt was pulseless and apneic. EMS started cpr, placed [place] airway, cbg normal, gave 8 rounds of epi. They note heart rhythm w brady-pea rhythm. Ems notes cpr ongoing for approximately 60-70 minutes upon ED arrival - during this period pt remained pulseless, no response to stimuli.   The history is provided by the patient and the EMS personnel. The history is limited by the condition of the patient.  Cardiac Arrest    Past Medical History:  Diagnosis Date  . Atrial fibrillation (HCC) 2011   from ED admission - pt doesn't remember being diagnosed with this.  . Diabetes mellitus without complication (HCC)   . Hypertension   . Stroke [facility](HCC)    left eye    Patient Active Problem List   Diagnosis Date Noted  . Septic prepatellar bursitis of right knee 06/25/2018    Past Surgical History:  Procedure Laterality Date  . APPLICATION OF WOUND VAC Right 06/26/2018   Procedure: APPLICATION OF PREVENA WOUND VAC;  Surgeon: Nadara Mustarduda, Marcus V, MD;  Location: [facility] OR;  Service: Orthopedics;  Laterality: Right;  . CESAREAN SECTION      x 2  . EYE SURGERY Bilateral    cataract surgery with lens implant   . I&D EXTREMITY Right 06/26/2018   Procedure: EXCISION PREPATELLA BURSA RIGHT KNEE;  Surgeon: Nadara Mustarduda, Marcus V, MD;  [facility];  Service: Orthopedics;  Laterality: Right;     OB History   None      Home Medications    Prior to Admission medications   Medication Sig Start Date End Date Taking? Authorizing Provider  acetaminophen (TYLENOL) 500 MG tablet Take  500 mg by mouth every 6 (six) hours as needed.    [provider]  amLODipine (NORVASC) 5 MG tablet Take 5 mg by mouth daily.    [provider]  aspirin EC 81 MG tablet Take 81 mg by mouth every other day.    [provider]  cephALEXin (KEFLEX) 500 MG capsule TK 1 C PO QID FOR 10 DAYS TO PREVENT SKIN INFECTION 06/17/18   [provider]  Cholecalciferol (VITAMIN D-3 PO) Take by mouth daily.    [provider]  digoxin (LANOXIN) 0.125 MG tablet Take 0.0625 mg by mouth daily at 12 noon.     [provider]  doxycycline (VIBRAMYCIN) 100 MG capsule TK 1 C PO BID 06/16/18   [provider]  furosemide (LASIX) 20 MG tablet Take 20 mg by mouth. Pt takes 20 mg at 2 PM on Mondays, Wednesdays and Fridays    [provider]  glimepiride (AMARYL) 4 MG tablet Take 4 mg by mouth every morning. 09/26/16   [provider]  hydrALAZINE (APRESOLINE) 25 MG tablet Take 25 mg by mouth 2 (two) times daily. Pt takes 1/2 tab twice a day    [provider]  HYDROcodone-acetaminophen (NORCO/VICODIN) 5-325 MG tablet Take 1 tablet by mouth every 4 (four) hours as needed for moderate pain. 06/26/18   Nadara Mustarduda, Marcus V, MD  insulin glargine (  LANTUS) 100 UNIT/ML injection Inject 30 Units into the skin daily after lunch.    [provider]  metFORMIN (GLUCOPHAGE) 500 MG tablet Take 1,000 mg by mouth 2 (two) times daily with a meal.    [provider]  metoprolol tartrate (LOPRESSOR) 25 MG tablet Take 25 mg by mouth 2 (two) times daily. 11/20/16   [provider]  ondansetron (ZOFRAN) 4 MG tablet Take 2 tablets (8 mg total) by mouth every 8 (eight) hours as needed for nausea or vomiting. 11/29/16   Azalia Bilis, MD  oxymetazoline (AFRIN) 0.05 % nasal spray Place 1 spray into both nostrils 2 (two) times daily.    [provider]  potassium chloride SA (K-DUR,KLOR-CON) 20 MEQ tablet Take 20 mEq by mouth 2 (two) times  daily. 10/23/16   [provider]  quinapril (ACCUPRIL) 40 MG tablet Take 20-40 mg by mouth 2 (two) times daily. 40 mg qam & 20 mg qhs    [provider]  rosuvastatin (CRESTOR) 10 MG tablet Take 10 mg by mouth daily.    [provider]  vitamin E 400 UNIT capsule Take 400 Units by mouth every morning.    [provider]    Family History No family history on file.  Social History Social History   Tobacco Use  . Smoking status: Passive Smoke Exposure - Never Smoker  . Smokeless tobacco: Never Used  Substance Use Topics  . Alcohol use: No  . Drug use: No     Allergies   Penicillins   Review of Systems Review of Systems  Unable to perform ROS: Patient unresponsive  level 5 caveat - pt unresponsive.    Physical Exam Updated Vital Signs There were no vitals taken for this visit.  Physical Exam  Constitutional: She appears well-developed and well-nourished.  Cpr in progress, unresponsive.   HENT:  Head: Atraumatic.  ETT  Eyes: Conjunctivae are normal. No scleral icterus.  Pupils fixed, unresponsive, no corneal reflex.   Neck: No tracheal deviation present.  Cardiovascular:  No heart sounds. No pulses. Cpr.   Pulmonary/Chest:  Apneic. bil breath sound w bag ventilation.  Abdominal: Soft. Normal appearance. She exhibits no distension. There is no tenderness.  Musculoskeletal: She exhibits no deformity.  Neurological:  Unresponsive. Apneic, pulseless, no response to stimuli.   Skin: Skin is dry. No rash noted. There is pallor.  Psychiatric:  Unresponsive.   Nursing note and vitals reviewed.    ED Treatments / Results  Labs (all labs ordered are listed, but only abnormal results are displayed) Labs Reviewed - No data to display  EKG None  Radiology No results found.  Procedures Procedures (including critical care time)  Medications Ordered in ED Medications - No data to display   Initial Impression / Assessment and  Plan / ED Course  I have reviewed the triage vital signs and the nursing notes.  Pertinent labs & imaging results that were available during my care of the patient were reviewed by me and considered in my medical decision making (see chart for details).  CPR continued on arrival. One additional round of epi. Cpr.   Bedside u/s - no cardiac activity. No pulses, no signs of life.   Pt pronounced dead at 02/14/2107.  Family notified.     Final Clinical Impressions(s) / ED Diagnoses   Final diagnoses:  None    ED Discharge Orders    None         Cathren Laine, MD 08/22/2018 02-13-25

## 2018-08-27 MED FILL — Medication: Qty: 1 | Status: AC

## 2018-08-30 DEATH — deceased

## 2018-09-01 ENCOUNTER — Ambulatory Visit: Payer: BLUE CROSS/BLUE SHIELD | Admitting: Physical Therapy
